# Patient Record
Sex: Male | Born: 1995 | Race: Asian | Hispanic: No | Marital: Single | Smoking: Never smoker
Health system: Southern US, Community
[De-identification: ages and names within clinical notes are randomized; demographics above are authoritative.]

## PROBLEM LIST (undated history)

## (undated) DIAGNOSIS — R112 Nausea with vomiting, unspecified: Secondary | ICD-10-CM

## (undated) DIAGNOSIS — J209 Acute bronchitis, unspecified: Secondary | ICD-10-CM

## (undated) DIAGNOSIS — I10 Essential (primary) hypertension: Secondary | ICD-10-CM

## (undated) DIAGNOSIS — N289 Disorder of kidney and ureter, unspecified: Secondary | ICD-10-CM

## (undated) HISTORY — DX: Nausea with vomiting, unspecified: R11.2

## (undated) HISTORY — PX: OTHER SURGICAL HISTORY: SHX169

## (undated) HISTORY — DX: Essential (primary) hypertension: I10

---
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
## 2015-02-25 ENCOUNTER — Emergency Department (HOSPITAL_COMMUNITY)
Admission: EM | Admit: 2015-02-25 | Discharge: 2015-02-25 | Disposition: A | Discharge status: Home / Self Care | Payer: 59 | Attending: Emergency Medicine | Admitting: Emergency Medicine

## 2015-02-25 ENCOUNTER — Encounter (HOSPITAL_COMMUNITY): Payer: Self-pay | Admitting: Emergency Medicine

## 2015-02-25 ENCOUNTER — Emergency Department (HOSPITAL_COMMUNITY): Payer: 59

## 2015-02-25 ENCOUNTER — Telehealth: Payer: Self-pay | Admitting: Family

## 2015-02-25 DIAGNOSIS — Z8709 Personal history of other diseases of the respiratory system: Secondary | ICD-10-CM | POA: Diagnosis not present

## 2015-02-25 DIAGNOSIS — R197 Diarrhea, unspecified: Secondary | ICD-10-CM | POA: Diagnosis not present

## 2015-02-25 DIAGNOSIS — R109 Unspecified abdominal pain: Secondary | ICD-10-CM | POA: Insufficient documentation

## 2015-02-25 DIAGNOSIS — I10 Essential (primary) hypertension: Secondary | ICD-10-CM | POA: Insufficient documentation

## 2015-02-25 DIAGNOSIS — Z87448 Personal history of other diseases of urinary system: Secondary | ICD-10-CM | POA: Diagnosis not present

## 2015-02-25 DIAGNOSIS — Z79899 Other long term (current) drug therapy: Secondary | ICD-10-CM | POA: Insufficient documentation

## 2015-02-25 DIAGNOSIS — R112 Nausea with vomiting, unspecified: Secondary | ICD-10-CM | POA: Diagnosis not present

## 2015-02-25 DIAGNOSIS — Z792 Long term (current) use of antibiotics: Secondary | ICD-10-CM | POA: Diagnosis not present

## 2015-02-25 HISTORY — DX: Disorder of kidney and ureter, unspecified: N28.9

## 2015-02-25 HISTORY — DX: Acute bronchitis, unspecified: J20.9

## 2015-02-25 LAB — URINALYSIS, ROUTINE W REFLEX MICROSCOPIC
Bilirubin Urine: NEGATIVE
GLUCOSE, UA: NEGATIVE mg/dL
KETONES UR: NEGATIVE mg/dL
LEUKOCYTES UA: NEGATIVE
Nitrite: NEGATIVE
PH: 5.5 (ref 5.0–8.0)
Protein, ur: >300 mg/dL — AB
Specific Gravity, Urine: 1.022 (ref 1.005–1.030)
Urobilinogen, UA: 0.2 mg/dL (ref 0.0–1.0)

## 2015-02-25 LAB — COMPREHENSIVE METABOLIC PANEL
ALBUMIN: 3.8 g/dL (ref 3.5–5.0)
ALK PHOS: 79 U/L (ref 38–126)
ALT: 15 U/L — AB (ref 17–63)
ANION GAP: 7 (ref 5–15)
AST: 22 U/L (ref 15–41)
BUN: 11 mg/dL (ref 6–20)
CO2: 28 mmol/L (ref 22–32)
Calcium: 9.3 mg/dL (ref 8.9–10.3)
Chloride: 102 mmol/L (ref 101–111)
Creatinine, Ser: 1.07 mg/dL (ref 0.61–1.24)
GFR calc Af Amer: >60 mL/min (ref >60)
GFR calc non Af Amer: >60 mL/min (ref >60)
GLUCOSE: 95 mg/dL (ref 65–99)
POTASSIUM: 3.8 mmol/L (ref 3.5–5.1)
SODIUM: 137 mmol/L (ref 135–145)
TOTAL PROTEIN: 6.9 g/dL (ref 6.5–8.1)
Total Bilirubin: 0.8 mg/dL (ref 0.3–1.2)

## 2015-02-25 LAB — URINE MICROSCOPIC-ADD ON

## 2015-02-25 LAB — LIPASE, BLOOD: LIPASE: 43 U/L (ref 22–51)

## 2015-02-25 LAB — CBC WITH DIFFERENTIAL/PLATELET
Basophils Absolute: 0 10*3/uL (ref 0.0–0.1)
Basophils Relative: 1 % (ref 0–1)
Eosinophils Absolute: 0.2 10*3/uL (ref 0.0–0.7)
Eosinophils Relative: 3 % (ref 0–5)
HCT: 45.3 % (ref 39.0–52.0)
Hemoglobin: 15.7 g/dL (ref 13.0–17.0)
Lymphocytes Relative: 30 % (ref 12–46)
Lymphs Abs: 1.9 10*3/uL (ref 0.7–4.0)
MCH: 29.5 pg (ref 26.0–34.0)
MCHC: 34.7 g/dL (ref 30.0–36.0)
MCV: 85.2 fL (ref 78.0–100.0)
Monocytes Absolute: 0.6 10*3/uL (ref 0.1–1.0)
Monocytes Relative: 9 % (ref 3–12)
Neutro Abs: 3.6 10*3/uL (ref 1.7–7.7)
Neutrophils Relative %: 57 % (ref 43–77)
Platelets: 222 10*3/uL (ref 150–400)
RBC: 5.32 MIL/uL (ref 4.22–5.81)
RDW: 12.5 % (ref 11.5–15.5)
WBC: 6.3 10*3/uL (ref 4.0–10.5)

## 2015-02-25 NOTE — Discharge Instructions (Signed)

## 2015-02-25 NOTE — Telephone Encounter (Signed)
Pt already scheduled on 03/10/15 at 10:30am.

## 2015-02-25 NOTE — ED Notes (Signed)
Pt. reports left abdominal pain with emesis , poor appetite / fatigue and diarrhea onset last week , denies fever or chills.

## 2015-02-25 NOTE — Telephone Encounter (Signed)
Please contact pt to arrange ED follow up. 

## 2015-02-25 NOTE — ED Provider Notes (Signed)
CSN: [id number]     Arrival date & time 02/25/15  0400 History   First MD Initiated Contact with Patient 02/25/15 [phone]     Chief Complaint  Patient presents with  . Abdominal Pain     (Consider location/radiation/quality/duration/timing/severity/associated sxs/prior Treatment) HPI Comments: Patient with hx of HTN and kidney disease [facility] presents to the ED with a chief complaint of left abdominal pain.  States that the pain is intermittent.  States that the pain can be sharp at times.  Reports associated nausea and vomiting last Friday and diarrhea yesterday.  Denies any fevers or chills. He has not taken anything to alleviate is symptoms. Mother states that he has not been taking his BP meds and believes to to be causing his symptoms.  There are no aggravating or alleviating factors.  Patient denies any pain in his testicles.  The pain does not radiate.  The history is provided by the patient. No language interpreter was used.    Past Medical History  Diagnosis Date  . Nausea and vomiting   . HTN (hypertension)   . Bronchitis, acute   . Kidney disease    Past Surgical History  Procedure Laterality Date  . No past suregeries    . Kidney biopsy     Family History  Problem Relation Age of Onset  . Hypertension Father   . Diabetes Maternal Grandmother   . Diabetes Paternal Grandfather    History  Substance Use Topics  . Smoking status: Never Smoker   . Smokeless tobacco: Never Used  . Alcohol Use: No    Review of Systems  Constitutional: Negative for fever and chills.  Respiratory: Negative for shortness of breath.   Cardiovascular: Negative for chest pain.  Gastrointestinal: Positive for nausea, vomiting, abdominal pain and diarrhea. Negative for constipation.  Genitourinary: Positive for flank pain. Negative for dysuria.  All other systems reviewed and are negative.     Allergies  Review of patient's allergies indicates no known  allergies.  Home Medications   Prior to Admission medications   Medication Sig Start Date End Date Taking? Authorizing Provider  albuterol (PROVENTIL HFA;VENTOLIN HFA) 108 (90 BASE) MCG/ACT inhaler Inhale 2 puffs into the lungs every 6 (six) hours as needed for wheezing or shortness of breath. 10/16/13   Morrell RiddleSarah L Weber, PA-C  azithromycin (ZITHROMAX) 250 MG tablet Take 2 tablets by mouth on day 1, followed by 1 tablet by mouth daily for 4 days. 09/11/14   Ramon DredgeEdward Saguier, PA-C  benzonatate (TESSALON) 100 MG capsule Take 1 capsule (100 mg total) by mouth 3 (three) times daily as needed for cough. 09/11/14   Ramon DredgeEdward Saguier, PA-C  lisinopril (PRINIVIL,ZESTRIL) 10 MG tablet Take 10 mg by mouth daily.    Historical Provider, MD  mycophenolate (CELLCEPT) 500 MG tablet Take 500 mg by mouth 2 (two) times daily.    Historical Provider, MD  Omega-3 Fatty Acids (FISH OIL) 1000 MG CAPS Take by mouth.    Historical Provider, MD   BP 114/62 mmHg  Pulse 59  Temp(Src) 98.1 F (36.7 C) (Oral)  Resp 16  Ht 5\' 8"  (1.727 m)  Wt 120 lb (54.432 kg)  BMI 18.25 kg/m2  SpO2 99% Physical Exam  Constitutional: He is oriented to person, place, and time. He appears well-developed and well-nourished.  HENT:  Head: Normocephalic and atraumatic.  Eyes: Conjunctivae and EOM are normal. Pupils are equal, round, and reactive to light. Right eye exhibits no discharge. Left eye exhibits no  discharge. No scleral icterus.  Neck: Normal range of motion. Neck supple. No JVD present.  Cardiovascular: Normal rate, regular rhythm and normal heart sounds.  Exam reveals no gallop and no friction rub.   No murmur heard. Pulmonary/Chest: Effort normal and breath sounds normal. No respiratory distress. He has no wheezes. He has no rales. He exhibits no tenderness.  Abdominal: Soft. He exhibits no distension and no mass. There is no tenderness. There is no rebound and no guarding.  No focal abdominal tenderness, no RLQ tenderness or pain  at McBurney's point, no RUQ tenderness or Murphy's sign, no left-sided abdominal tenderness, no fluid wave, or signs of peritonitis  No CVA tenderness  Musculoskeletal: Normal range of motion. He exhibits no edema or tenderness.  Neurological: He is alert and oriented to person, place, and time.  Skin: Skin is warm and dry.  Psychiatric: He has a normal mood and affect. His behavior is normal. Judgment and thought content normal.  Nursing note and vitals reviewed.   ED Course  Procedures (including critical care time) Results for orders placed or performed during the hospital encounter of 02/25/15  CBC with Differential  Result Value Ref Range   WBC 6.3 4.0 - 10.5 K/uL   RBC 5.32 4.22 - 5.81 MIL/uL   Hemoglobin 15.7 13.0 - 17.0 g/dL   HCT 21.345.3 08.639.0 - 57.852.0 %   MCV 85.2 78.0 - 100.0 fL   MCH 29.5 26.0 - 34.0 pg   MCHC 34.7 30.0 - 36.0 g/dL   RDW 46.912.5 62.911.5 - 52.815.5 %   Platelets 222 150 - 400 K/uL   Neutrophils Relative % 57 43 - 77 %   Neutro Abs 3.6 1.7 - 7.7 K/uL   Lymphocytes Relative 30 12 - 46 %   Lymphs Abs 1.9 0.7 - 4.0 K/uL   Monocytes Relative 9 3 - 12 %   Monocytes Absolute 0.6 0.1 - 1.0 K/uL   Eosinophils Relative 3 0 - 5 %   Eosinophils Absolute 0.2 0.0 - 0.7 K/uL   Basophils Relative 1 0 - 1 %   Basophils Absolute 0.0 0.0 - 0.1 K/uL  Comprehensive metabolic panel  Result Value Ref Range   Sodium 137 135 - 145 mmol/L   Potassium 3.8 3.5 - 5.1 mmol/L   Chloride 102 101 - 111 mmol/L   CO2 28 22 - 32 mmol/L   Glucose, Bld 95 65 - 99 mg/dL   BUN 11 6 - 20 mg/dL   Creatinine, Ser 4.131.07 0.61 - 1.24 mg/dL   Calcium 9.3 8.9 - 24.410.3 mg/dL   Total Protein 6.9 6.5 - 8.1 g/dL   Albumin 3.8 3.5 - 5.0 g/dL   AST 22 15 - 41 U/L   ALT 15 (L) 17 - 63 U/L   Alkaline Phosphatase 79 38 - 126 U/L   Total Bilirubin 0.8 0.3 - 1.2 mg/dL   GFR calc non Af Amer >60 >60 mL/min   GFR calc Af Amer >60 >60 mL/min   Anion gap 7 5 - 15  Lipase, blood  Result Value Ref Range   Lipase 43  22 - 51 U/L  Urinalysis, Routine w reflex microscopic  Result Value Ref Range   Color, Urine YELLOW YELLOW   APPearance CLEAR CLEAR   Specific Gravity, Urine 1.022 1.005 - 1.030   pH 5.5 5.0 - 8.0   Glucose, UA NEGATIVE NEGATIVE mg/dL   Hgb urine dipstick LARGE (A) NEGATIVE   Bilirubin Urine NEGATIVE NEGATIVE   Ketones, ur  NEGATIVE NEGATIVE mg/dL   Protein, ur >045 (A) NEGATIVE mg/dL   Urobilinogen, UA 0.2 0.0 - 1.0 mg/dL   Nitrite NEGATIVE NEGATIVE   Leukocytes, UA NEGATIVE NEGATIVE  Urine microscopic-add on  Result Value Ref Range   Squamous Epithelial / LPF FEW (A) RARE   WBC, UA 0-2 <3 WBC/hpf   RBC / HPF 21-50 <3 RBC/hpf   Bacteria, UA FEW (A) RARE   Urine-Other MUCOUS PRESENT    Ct Abdomen Pelvis Wo Contrast  02/25/2015   CLINICAL DATA:  Left abdominal pain with vomiting, diarrhea starting last week, decreased appetite blood in urine  EXAM: CT ABDOMEN AND PELVIS WITHOUT CONTRAST  TECHNIQUE: Multidetector CT imaging of the abdomen and pelvis was performed following the standard protocol without IV contrast.  COMPARISON:  CT scan 02/19/2013  FINDINGS: Sagittal images of the spine are unremarkable. The study is suboptimal without IV and oral contrast. Lung bases are unremarkable. Unenhanced liver shows no biliary ductal dilatation.  No calcified gallstones are noted within gallbladder. No aortic aneurysm. Unenhanced pancreas, spleen and adrenal glands are unremarkable. Unenhanced kidneys are symmetrical in size. No nephrolithiasis. No hydronephrosis or hydroureter. No calcified ureteral calculi.  There is no pericecal inflammation. Normal appendix noted in axial image 60.  No small bowel obstruction. No ascites or free air. No adenopathy. Bilateral distal ureter is unremarkable. No calcified calculi are noted within urinary bladder. Prostate gland and seminal vesicles are thick bar global. Moderate colonic gas noted within distended rectum. There is no inguinal adenopathy. No destructive  bony lesions are noted within pelvis.  IMPRESSION: 1. No nephrolithiasis.  No hydronephrosis or hydroureter. 2. Normal appendix.  No pericecal inflammation. 3. No calcified ureteral calculi. 4. No small bowel obstruction. 5. Moderate gas noted within distended rectum.   Electronically Signed   By: Natasha Mead M.D.   On: 02/25/2015 07:59      EKG Interpretation None      MDM   Final diagnoses:  Flank pain    Patient with left flank/abdominal pain.  Hx of kidney disease and HTN.  Not complaint with BP meds.  UA is remarkable for RBCs and hemoglobin.  No other lab abnormalities.  Normal Cr.  Will check CT to rule out stone or other emergent process.  CT is negative for acute process.  Will recommend follow-up with his specialist.  VSS.  Labs are reassuring here.   Roxy Horseman, PA-C 02/25/15 [id number]  Blake Divine, MD 02/25/15 [phone]

## 2015-03-10 ENCOUNTER — Ambulatory Visit (INDEPENDENT_AMBULATORY_CARE_PROVIDER_SITE_OTHER): Payer: 59 | Admitting: Family

## 2015-03-10 ENCOUNTER — Encounter: Payer: Self-pay | Admitting: Family

## 2015-03-10 VITALS — BP 110/52 | HR 56 | Temp 97.6°F | Resp 16 | Ht 69.0 in | Wt 130.8 lb

## 2015-03-10 DIAGNOSIS — B351 Tinea unguium: Secondary | ICD-10-CM

## 2015-03-10 DIAGNOSIS — R112 Nausea with vomiting, unspecified: Secondary | ICD-10-CM | POA: Diagnosis not present

## 2015-03-10 DIAGNOSIS — L609 Nail disorder, unspecified: Secondary | ICD-10-CM | POA: Diagnosis not present

## 2015-03-10 DIAGNOSIS — I10 Essential (primary) hypertension: Secondary | ICD-10-CM | POA: Insufficient documentation

## 2015-03-10 DIAGNOSIS — N289 Disorder of kidney and ureter, unspecified: Secondary | ICD-10-CM | POA: Diagnosis not present

## 2015-03-10 DIAGNOSIS — N028 Recurrent and persistent hematuria with other morphologic changes: Secondary | ICD-10-CM | POA: Insufficient documentation

## 2015-03-10 NOTE — Progress Notes (Signed)
Subjective:    Patient ID: Seth Jimenez, male    DOB: 12/12/1995, 19 y.o.   MRN: [id number]  HPI   Seth Jimenez is an 19 yr old male who presents today for ED follow up. He was seen in the ED on 5/18 with chief complaint of left sided abdominal pain. Records are reviewed.  He had associated nausea, vomiting and diarrhea prior to his ED visit.  ED records are reviewed. He was noted to have blood in his urine (he is followed by Nephrology at [facility] for history of kidney disease).  CT abd/pelvis was performed which was unremarkable.  Reports that symptoms are resolved.    Kidney disease- he is maintained on cellcept.  He see Dr. Dionicio Stall- pediatric nephrologist at [facility]. He does not know anything more about his kidney disease- reports that it is "mild."   He is not currently taking lisinopril.  States that his BP has been ok.   Reports some discoloration beneath the nail beds on both great toes.    Review of Systems See HPI  Past Medical History  Diagnosis Date  . Nausea and vomiting   . HTN (hypertension)   . Bronchitis, acute   . Kidney disease     History   Social History  . Marital Status: Single    Spouse Name: N/A  . Number of Children: N/A  . Years of Education: N/A   Occupational History  . Not on file.   Social History Main Topics  . Smoking status: Never Smoker   . Smokeless tobacco: Never Used  . Alcohol Use: No  . Drug Use: No  . Sexual Activity: Not on file   Other Topics Concern  . Not on file   Social History Narrative   Patient is a Consulting civil engineer at [employer]    Past Surgical History  Procedure Laterality Date  . No past suregeries    . Kidney biopsy      Family History  Problem Relation Age of Onset  . Hypertension Father   . Diabetes Maternal Grandmother   . Diabetes Paternal Grandfather     No Known Allergies  Current Outpatient Prescriptions on File Prior to Visit  Medication Sig Dispense Refill  . mycophenolate  (CELLCEPT) 500 MG tablet Take 500 mg by mouth 2 (two) times daily.    Marland Kitchen albuterol (PROVENTIL HFA;VENTOLIN HFA) 108 (90 BASE) MCG/ACT inhaler Inhale 2 puffs into the lungs every 6 (six) hours as needed for wheezing or shortness of breath. (Patient not taking: Reported on 02/25/2015) 1 Inhaler 0  . lisinopril (PRINIVIL,ZESTRIL) 10 MG tablet Take 10 mg by mouth daily.    Marland Kitchen lisinopril (PRINIVIL,ZESTRIL) 30 MG tablet Take 30 mg by mouth daily.    . Omega-3 Fatty Acids (FISH OIL) 1000 MG CAPS Take by mouth.     Current Facility-Administered Medications on File Prior to Visit  Medication Dose Route Frequency Provider Last Rate Last Dose  . albuterol (PROVENTIL) (2.5 MG/3ML) 0.083% nebulizer solution 2.5 mg  2.5 mg Nebulization Once Morrell Riddle, PA-C        BP 110/52 mmHg  Pulse 56  Temp(Src) 97.6 F (36.4 C) (Oral)  Resp 16  Ht  (1.753 m)  Wt 130 lb 12.8 oz (59.33 kg)  BMI 19.31 kg/m2  SpO2 99%       Objective:   Physical Exam  Constitutional: He is oriented to person, place, and time. He appears well-developed and well-nourished. No distress.  HENT:  Head: Normocephalic and atraumatic.  Cardiovascular: Normal rate and regular rhythm.   No murmur heard. Pulmonary/Chest: Effort normal and breath sounds normal. No respiratory distress. He has no wheezes. He has no rales.  Musculoskeletal: He exhibits no edema.  Neurological: He is alert and oriented to person, place, and time.  Skin: Skin is warm and dry.  Bilateral great toenails have some dark discoloration beneath distal nail tip   Psychiatric: He has a normal mood and affect. His behavior is normal. Thought content normal.          Assessment & Plan:

## 2015-03-10 NOTE — Assessment & Plan Note (Signed)
Will request records from his nephrologist.  Details unclear.

## 2015-03-10 NOTE — Assessment & Plan Note (Signed)
BP stable off of lisinopril.  Not clear if nephrology wishes for him to be on ACE for renal reasons. Will request records.

## 2015-03-10 NOTE — Assessment & Plan Note (Signed)
?   Fungal versus trauma. Will refer to dermatology for further evaluation.

## 2015-03-10 NOTE — Patient Instructions (Signed)
Please schedule a complete physical at the front desk.  You will be contacted about your referral to dermatology.

## 2015-03-10 NOTE — Progress Notes (Signed)
Pre visit review using our clinic review tool, if applicable. No additional management support is needed unless otherwise documented below in the visit note. 

## 2015-03-10 NOTE — Assessment & Plan Note (Signed)
Resolved. Sounded like a viral gastroenteritis.

## 2015-03-25 ENCOUNTER — Telehealth: Payer: Self-pay | Admitting: Family

## 2015-03-25 NOTE — Telephone Encounter (Signed)
Pre Visit letter sent  °

## 2015-03-26 ENCOUNTER — Telehealth: Payer: Self-pay | Admitting: *Deleted

## 2015-03-26 NOTE — Telephone Encounter (Signed)
Received records from Dionicio Stall at [facility] and forwarded to Provider for review.

## 2015-04-15 ENCOUNTER — Telehealth: Payer: Self-pay | Admitting: *Deleted

## 2015-04-15 NOTE — Telephone Encounter (Signed)
Patient answered phone for PVC and confirmed name and DOB, but when asked about appointment he was not aware of it and hung up phone.

## 2015-04-16 ENCOUNTER — Encounter: Payer: Self-pay | Admitting: Family

## 2015-04-16 ENCOUNTER — Ambulatory Visit (INDEPENDENT_AMBULATORY_CARE_PROVIDER_SITE_OTHER): Payer: Commercial Managed Care - HMO | Admitting: Family

## 2015-04-16 ENCOUNTER — Telehealth: Payer: Self-pay | Admitting: Family

## 2015-04-16 VITALS — BP 106/70 | HR 53 | Temp 97.6°F | Resp 16 | Ht 69.0 in | Wt 132.0 lb

## 2015-04-16 DIAGNOSIS — Z23 Encounter for immunization: Secondary | ICD-10-CM | POA: Diagnosis not present

## 2015-04-16 DIAGNOSIS — Z0000 Encounter for general adult medical examination without abnormal findings: Secondary | ICD-10-CM | POA: Diagnosis not present

## 2015-04-16 DIAGNOSIS — N028 Recurrent and persistent hematuria with other morphologic changes: Secondary | ICD-10-CM

## 2015-04-16 LAB — LIPID PANEL
CHOL/HDL RATIO: 3
CHOLESTEROL: 188 mg/dL (ref 0–200)
HDL: 62.3 mg/dL (ref >39.00)
LDL CALC: 109 mg/dL — AB (ref 0–99)
NONHDL: 125.7
TRIGLYCERIDES: 84 mg/dL (ref 0.0–149.0)
VLDL: 16.8 mg/dL (ref 0.0–40.0)

## 2015-04-16 LAB — HEPATITIS B SURFACE ANTIBODY, QUANTITATIVE: Hepatitis B-Post: 0.6 m[IU]/mL

## 2015-04-16 NOTE — Assessment & Plan Note (Signed)
Immunizations reviewed- due for meningoccocal booster, varicella #2 and Hep A #2.  Obtain MMR and Hep B titers (needs evaluated for school).    Continue healthy diet, exercise.  Obtain routine labs.  Obtain lipid panel.  Had recent cmet, cbc.

## 2015-04-16 NOTE — Patient Instructions (Signed)
Please complete lab work prior to leaving.  We will contact you with your results and further recommendations.  

## 2015-04-16 NOTE — Telephone Encounter (Addendum)
Opened in error

## 2015-04-16 NOTE — Progress Notes (Signed)
Subjective:    Patient ID: Seth Jimenez, male    DOB: 1996/02/26, 19 y.o.   MRN: [id number]  HPI  Patient presents today for complete physical.  Immunizations: Diet: healthy Exercise: badminton, and gym Eye:  Exam up to date Dental: has apt today Was evaluated in ED in May for flank pain which has resolved.   Kidney disease- [facility] with [facility].     Review of Systems  Constitutional: Negative for unexpected weight change.  HENT: Negative for rhinorrhea.   Respiratory: Negative for cough and shortness of breath.   Cardiovascular: Negative for chest pain.  Gastrointestinal: Negative for diarrhea and constipation.  Genitourinary: Negative for dysuria and frequency.  Musculoskeletal: Negative for myalgias and arthralgias.  Skin: Negative for rash.  Neurological: Negative for headaches.  Hematological: Negative for adenopathy.  Psychiatric/Behavioral:       Denies depression/anxiety   Past Medical History  Diagnosis Date  . Nausea and vomiting   . HTN (hypertension)   . Bronchitis, acute   . Kidney disease     History   Social History  . Marital Status: Single    Spouse Name: N/A  . Number of Children: N/A  . Years of Education: N/A   Occupational History  . Not on file.   Social History Main Topics  . Smoking status: Never Smoker   . Smokeless tobacco: Never Used  . Alcohol Use: No  . Drug Use: No  . Sexual Activity: Not on file   Other Topics Concern  . Not on file   Social History Narrative   Patient is a Consulting civil engineerstudent at [employer]    Past Surgical History  Procedure Laterality Date  . No past suregeries    . Kidney biopsy      Family History  Problem Relation Age of Onset  . Hypertension Father   . Diabetes Maternal Grandmother   . Diabetes Paternal Grandfather     No Known Allergies  Current Outpatient Prescriptions on File Prior to Visit  Medication Sig  Dispense Refill  . mycophenolate (CELLCEPT) 500 MG tablet Take 500 mg by mouth 2 (two) times daily.    . Omega-3 Fatty Acids (FISH OIL) 1000 MG CAPS Take by mouth.     Current Facility-Administered Medications on File Prior to Visit  Medication Dose Route Frequency Provider Last Rate Last Dose  . albuterol (PROVENTIL) (2.5 MG/3ML) 0.083% nebulizer solution 2.5 mg  2.5 mg Nebulization Once Morrell RiddleSarah L Weber, PA-C        BP 106/70 mmHg  Pulse 53  Temp(Src) 97.6 F (36.4 C) (Oral)  Resp 16  Ht 5\' 9"  (1.753 m)  Wt 132 lb (59.875 kg)  BMI 19.48 kg/m2  SpO2 98%       Objective:   Physical Exam  Physical Exam  Constitutional: He is oriented to person, place, and time. He appears well-developed and well-nourished. No distress.  HENT:  Head: Normocephalic and atraumatic.  Right Ear: Tympanic membrane and ear canal normal.  Left Ear: Tympanic membrane and ear canal normal.  Mouth/Throat: Oropharynx is clear and moist.  Eyes: Pupils are equal, round, and reactive to light. No scleral icterus.  Neck: Normal range of motion. No thyromegaly present.  Cardiovascular: Normal rate and regular rhythm.   No murmur heard. Pulmonary/Chest: Effort normal and breath sounds normal. No respiratory distress. He has no wheezes. He has no rales. He exhibits no tenderness.  Abdominal:  Soft. Bowel sounds are normal. He exhibits no distension and no mass. There is no tenderness. There is no rebound and no guarding.  Musculoskeletal: He exhibits no edema.  Lymphadenopathy:    He has no cervical adenopathy.  Neurological: He is alert and oriented to person, place, and time. He has normal patellar reflexes. He exhibits normal muscle tone. Coordination normal.  Skin: Skin is warm and dry.  Psychiatric: He has a normal mood and affect. His behavior is normal. Judgment and thought content normal.          Assessment & Plan:         Assessment & Plan:

## 2015-04-16 NOTE — Progress Notes (Signed)
Pre visit review using our clinic review tool, if applicable. No additional management support is needed unless otherwise documented below in the visit note. 

## 2015-04-16 NOTE — Assessment & Plan Note (Addendum)
Maintained on cellcept per Dr.Chen. Last Cr WNL. Reviewed notes from Dr. Nicky Pughhen's office. He has also placed the pt on vit D supplement and continued his lisinopril due to hx of elevated BP.  Lab Results  Component Value Date   CREATININE 1.07 02/25/2015

## 2015-04-16 NOTE — Addendum Note (Signed)
Addended by: Sandford Craze'SULLIVAN, Avonlea Sima on: 04/16/2015 01:19 PM   Modules accepted: Medications, SmartSet

## 2015-04-17 LAB — MEASLES/MUMPS/RUBELLA IMMUNITY
MUMPS IGG: 123 [AU]/ml — AB (ref <9.00)
Rubella: 0.79 Index (ref <0.90)
Rubeola IgG: 50.6 AU/mL — ABNORMAL HIGH (ref <25.00)

## 2015-04-20 ENCOUNTER — Telehealth: Payer: Self-pay | Admitting: Family

## 2015-04-20 NOTE — Telephone Encounter (Signed)
Reviewed titers.  Pt needs the following vaccines please:  MMR booster Hep B series Dtap series- we have no record of this,  Usually given to infants.  Can he request records from his high school?  If they do not have, then we will need to administer series.

## 2015-04-21 NOTE — Telephone Encounter (Signed)
Left detailed message on pt's cell# re: below recommendations and to return my call.

## 2015-05-01 NOTE — Telephone Encounter (Signed)
Patient mom states that patient will be picking up records from pediatric office and will callback as to what immunizations he has had. [phone]

## 2015-05-12 ENCOUNTER — Telehealth: Payer: Self-pay | Admitting: Family

## 2015-05-12 ENCOUNTER — Other Ambulatory Visit: Payer: Self-pay | Admitting: Family

## 2015-05-12 ENCOUNTER — Other Ambulatory Visit (INDEPENDENT_AMBULATORY_CARE_PROVIDER_SITE_OTHER): Payer: Commercial Managed Care - HMO

## 2015-05-12 ENCOUNTER — Ambulatory Visit: Payer: Commercial Managed Care - HMO

## 2015-05-12 DIAGNOSIS — Z0000 Encounter for general adult medical examination without abnormal findings: Secondary | ICD-10-CM

## 2015-05-12 LAB — HEPATITIS B SURFACE ANTIBODY,QUALITATIVE: Hep B S Ab: NEGATIVE

## 2015-05-12 NOTE — Telephone Encounter (Signed)
Reviewed pt records.  Missing  Dtap, polio, MMR, hep B series. Will obtain titers and make further recs.

## 2015-05-13 LAB — MEASLES/MUMPS/RUBELLA IMMUNITY
MUMPS IGG: 123 [AU]/ml — AB (ref <9.00)
Rubella: 0.68 Index (ref <0.90)
Rubeola IgG: 42 AU/mL — ABNORMAL HIGH (ref <25.00)

## 2015-05-18 ENCOUNTER — Telehealth: Payer: Self-pay | Admitting: *Deleted

## 2015-05-18 LAB — POLIOVIRUS ANTIBODIES, TYPES 1, 2, AND 3

## 2015-05-18 NOTE — Telephone Encounter (Signed)
Per verbal from PCP, after further review of immunization requirements, Polio is not required. Cancelled poliovirus panel and confirmed that echovirus has already been cancelled per [place] at [place].

## 2015-05-18 NOTE — Telephone Encounter (Signed)
Reviewed titers and immunization requirements:  Advise:  That he start Hep B series.  Also needs MMR now and again in 4 weeks. Meningococcal booster

## 2015-05-18 NOTE — Telephone Encounter (Signed)
Spoke with Lilia at [facility]. She states polio panel was not ordered and she will see if they can add it on now. If so, turnaround time will be 7 days. Awaiting status from [place].

## 2015-05-18 NOTE — Telephone Encounter (Signed)
-----   Message from Sandford Craze, NP sent at 05/18/2015 12:53 PM EDT ----- Could you please check status of the poli panel?

## 2015-05-19 NOTE — Telephone Encounter (Signed)
Left message for pt to return my call.

## 2015-05-19 NOTE — Telephone Encounter (Signed)
Notified pt's mom and she voices understanding. Nurse visit scheduled for 05/21/15 at 2pm.  Pt will get 1st Hep B, 1st MMR and meningococcal booster.  Pt will need to return in 1 month for 2nd Hept B and MMR. Vaccine order set initiated and forwarded to PCP for completion.

## 2015-05-21 ENCOUNTER — Ambulatory Visit: Payer: Commercial Managed Care - HMO

## 2015-05-21 DIAGNOSIS — Z23 Encounter for immunization: Secondary | ICD-10-CM

## 2015-05-21 LAB — OTHER SOLSTAS TEST: Miscellaneous Test: 35139

## 2015-05-21 MED ORDER — HEPATITIS B VAC RECOMBINANT 5 MCG/0.5ML IJ SUSP
0.5000 mL | Freq: Once | INTRAMUSCULAR | Status: AC
  Administered 2015-05-21: 5 ug via INTRAMUSCULAR

## 2015-05-22 ENCOUNTER — Telehealth: Payer: Self-pay | Admitting: Family

## 2015-05-22 NOTE — Telephone Encounter (Signed)
Pt did not receive the MMR vaccine at the nurse visit.  Could you please arrange nurse visit for when I am here and I can complete his form at time of his visit. Meningococcal booster is complete. He does not need this.

## 2015-05-25 ENCOUNTER — Telehealth: Payer: Self-pay | Admitting: Family

## 2015-05-25 ENCOUNTER — Telehealth: Payer: Self-pay | Admitting: *Deleted

## 2015-05-25 NOTE — Telephone Encounter (Signed)
See 05/25/15 phone note 

## 2015-05-25 NOTE — Telephone Encounter (Signed)
Scheduled for 05/26/15 10:30am for MMR. I notified mom that you will call her back once you speak to Melissa about the titer. Ph# [phone]. °

## 2015-05-25 NOTE — Telephone Encounter (Signed)
Left detailed message on mother's cell # that immunization record has been placed at front desk but pt still needs to start MMR series and to call back to schedule appt. Also placed note on immunization record to schedule nurse visit.

## 2015-05-25 NOTE — Telephone Encounter (Signed)
Yes, rubella titer was negative. Thanks.

## 2015-05-25 NOTE — Telephone Encounter (Signed)
Left detailed message on cell# for pt to call me back and start MMR.  Will need 2nd MMR in 4 weeks (when returns for 2nd hep B).

## 2015-05-25 NOTE — Telephone Encounter (Signed)
Melissa--did you see Mumps titer that I printed on this pt?  Will pt still need MMR series?

## 2015-05-25 NOTE — Telephone Encounter (Signed)
Spoke with Alcario Drought at [place]. She is not sure how test was resulted when it was cancelled in the system. She originally thought test was cancelled after the specimen was received in the lab and sent to [facility] for testing. Advised her that per EMR tracking, test was ordered at 14:56 on 05/12/15 and cancelled at 15:03 on 05/12/15 in the EMR and I received confirmation on 05/18/15 that test was cancelled. She will remove charge for echovirus panel.  Notes Recorded by Sandford Craze, NP on 05/21/2015 at 3:11 PM Could you ask lab to remove charges for this test, I dont' think pt should be charged since we asked for test to be cancelled.

## 2015-05-25 NOTE — Telephone Encounter (Signed)
Notified pt's mom that pt should keep nurse visit for tomorrow and pt will need to schedule nurse visit in 1 month for 2nd Hep B and MMR.

## 2015-05-25 NOTE — Telephone Encounter (Signed)
Caller name: Markeis Allman Relationship to patient: mother Can be reached: [phone]  Reason for call: Pt needs copy of immunization record printed. He will come in to pick up. Please call mom when it is printed.

## 2015-05-25 NOTE — Telephone Encounter (Signed)
-----   Message from Sandford Craze, NP sent at 05/21/2015  3:11 PM EDT ----- Could you ask lab to remove charges for this test, I dont' think pt should be charged since we asked for test to be cancelled.

## 2015-05-25 NOTE — Telephone Encounter (Signed)
Form copied and will be forwarded to scanning once pt completes MMR tomorrow.  Form placed in triage room folder.

## 2015-05-26 ENCOUNTER — Ambulatory Visit (INDEPENDENT_AMBULATORY_CARE_PROVIDER_SITE_OTHER): Payer: Commercial Managed Care - HMO | Admitting: *Deleted

## 2015-05-26 DIAGNOSIS — Z23 Encounter for immunization: Secondary | ICD-10-CM

## 2015-05-26 DIAGNOSIS — IMO0001 Reserved for inherently not codable concepts without codable children: Secondary | ICD-10-CM

## 2015-05-26 NOTE — Progress Notes (Signed)
Pre visit review using our clinic review tool, if applicable. No additional management support is needed unless otherwise documented below in the visit note.  Patient presented for MMR (per phone note 05/25/15) for school.   Patient tolerated injection well.

## 2015-06-26 ENCOUNTER — Ambulatory Visit (INDEPENDENT_AMBULATORY_CARE_PROVIDER_SITE_OTHER): Payer: Commercial Managed Care - HMO | Admitting: *Deleted

## 2015-06-26 DIAGNOSIS — Z23 Encounter for immunization: Secondary | ICD-10-CM | POA: Diagnosis not present

## 2015-06-26 NOTE — Progress Notes (Signed)
Pre visit review using our clinic review tool, if applicable. No additional management support is needed unless otherwise documented below in the visit note.  Per phone note:   Ronny Flurry, CMA at 05/25/2015 3:06 PM     Status: Signed       Expand All Collapse All   Notified pt's mom that pt should keep nurse visit for tomorrow and pt will need to schedule nurse visit in 1 month for 2nd Hep B and MMR.       Patient tolerated injection well.  Last Hep B injection scheduled 12/24/14.

## 2015-07-08 ENCOUNTER — Telehealth: Payer: Self-pay | Admitting: *Deleted

## 2015-07-08 NOTE — Telephone Encounter (Signed)
Called patient and left voicemail to notify that he has [employer] forms here (immunization records, etc) and they are available at front desk for pick-up.

## 2015-11-06 ENCOUNTER — Telehealth: Payer: Self-pay | Admitting: Family

## 2015-11-06 NOTE — Telephone Encounter (Signed)
Left message for patient to call with Flu Shot information °

## 2015-12-14 ENCOUNTER — Telehealth: Payer: Self-pay | Admitting: Family

## 2015-12-14 NOTE — Telephone Encounter (Signed)
LM with mother for pt to call and schedule flu shot or update records.

## 2015-12-24 ENCOUNTER — Ambulatory Visit (INDEPENDENT_AMBULATORY_CARE_PROVIDER_SITE_OTHER): Payer: Commercial Managed Care - HMO | Admitting: Behavioral Health

## 2015-12-24 DIAGNOSIS — Z23 Encounter for immunization: Secondary | ICD-10-CM

## 2015-12-24 NOTE — Progress Notes (Signed)
Pre visit review using our clinic review tool, if applicable. No additional management support is needed unless otherwise documented below in the visit note.  Patient in office today for Hepatitis B vaccine. IM given in Left Deltoid. Patient tolerated injection well.

## 2017-10-25 ENCOUNTER — Encounter: Payer: Self-pay | Admitting: Physician Assistant

## 2017-10-25 ENCOUNTER — Ambulatory Visit: Payer: 59 | Admitting: Physician Assistant

## 2017-10-25 VITALS — BP 122/72 | HR 78 | Temp 98.1°F | Resp 17 | Ht 70.0 in | Wt 139.0 lb

## 2017-10-25 DIAGNOSIS — J069 Acute upper respiratory infection, unspecified: Secondary | ICD-10-CM | POA: Diagnosis not present

## 2017-10-25 MED ORDER — GUAIFENESIN ER 1200 MG PO TB12: 1.0000 | ORAL_TABLET | Freq: Two times a day (BID) | ORAL | 1 refills | Status: DC | PRN

## 2017-10-25 MED ORDER — AMOXICILLIN 500 MG PO CAPS: 500.0000 mg | ORAL_CAPSULE | Freq: Two times a day (BID) | ORAL | 0 refills | Status: AC

## 2017-10-25 MED ORDER — IPRATROPIUM BROMIDE 0.03 % NA SOLN: 2.0000 | Freq: Two times a day (BID) | NASAL | 0 refills | Status: DC

## 2017-10-25 MED ORDER — BENZONATATE 100 MG PO CAPS: 100.0000 mg | ORAL_CAPSULE | Freq: Three times a day (TID) | ORAL | 0 refills | Status: DC | PRN

## 2017-10-25 NOTE — Progress Notes (Deleted)
[facility] [address], [place] [id number] [phone]  Date:  10/25/2017   Name:  Seth Jimenez   DOB:  June 02, 1996   MRN:  [id number]  PCP:  Sandford Craze'Sullivan, Melissa, NP    History of Present Illness:  Seth Jimenez is a 22 y.o. male patient who presents to PCP with  Chief Complaint  Patient presents with  . Cough       Patient Active Problem List   Diagnosis Date Noted  . Preventative health care 04/16/2015  . Nail problem 03/10/2015  . 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 HTN (hypertension) 03/10/2015  . Acne 09/05/2012    Past Medical History:  Diagnosis Date  . Bronchitis, acute   . HTN (hypertension)   . Kidney disease    IgA nephropathy  . Nausea and vomiting     Past Surgical History:  Procedure Laterality Date  . kidney biopsy  03/2013   IgA nephropathy with mesangial hyperscellularity, mild acute tubulo-interstital nephritis   . no past suregeries      Social History   Tobacco Use  . Smoking status: Never Smoker  . Smokeless tobacco: Never Used  Substance Use Topics  . Alcohol use: No  . Drug use: No    Family History  Problem Relation Age of Onset  . Hypertension Father   . Diabetes Maternal Grandmother   . Diabetes Paternal Grandfather     No Known Allergies  Medication list has been reviewed and updated.  Current Outpatient Medications on File Prior to Visit  Medication Sig Dispense Refill  . lisinopril (PRINIVIL,ZESTRIL) 30 MG tablet Take 30 mg by mouth daily.    . mycophenolate (CELLCEPT) 500 MG tablet Take 500 mg by mouth 2 (two) times daily.    . Omega-3 Fatty Acids (FISH OIL) 1000 MG CAPS Take by mouth.    . Vitamin D, Ergocalciferol, (DRISDOL) 50000 UNITS CAPS capsule Take 50,000 Units by mouth every 7 (seven) days.     Current Facility-Administered Medications on File Prior to Visit  Medication Dose Route Frequency Provider Last Rate Last Dose  . albuterol (PROVENTIL) (2.5 MG/3ML) 0.083% nebulizer solution 2.5 mg   2.5 mg Nebulization Once Weber, Sarah L, PA-C        ROS ROS otherwise unremarkable unless listed above.  Physical Examination: BP 122/72   Pulse 78   Temp 98.1 F (36.7 C) (Oral)   Resp 17   Ht 5\' 10"  (1.778 m)   Wt 139 lb (63 kg)   SpO2 98%   BMI 19.94 kg/m  Ideal Body Weight: Weight in (lb) to have BMI = 25: 173.9  Physical Exam   Assessment and Plan: Seth Jimenez is a 22 y.o. male who is here today  There are no diagnoses linked to this encounter.  Trena PlattStephanie English, PA-C [facility] 10/25/2017 4:51 PM

## 2017-10-25 NOTE — Patient Instructions (Addendum)
Please push hydration such as 64 oz of water per day.   You can also try a humidifier in your room as well as a nasal saline spray.  Please try it.  This can be obtained at any pharmacy or [employer], or grocery store.   Tylenol for pain and fever.  Upper Respiratory Infection, Adult Most upper respiratory infections (URIs) are caused by a virus. A URI affects the nose, throat, and upper air passages. The most common type of URI is often called "the common cold." Follow these instructions at home:  Take medicines only as told by your doctor.  Gargle warm saltwater or take cough drops to comfort your throat as told by your doctor.  Use a warm mist humidifier or inhale steam from a shower to increase air moisture. This may make it easier to breathe.  Drink enough fluid to keep your pee (urine) clear or pale yellow.  Eat soups and other clear broths.  Have a healthy diet.  Rest as needed.  Go back to work when your fever is gone or your doctor says it is okay. ? You may need to stay home longer to avoid giving your URI to others. ? You can also wear a face mask and wash your hands often to prevent spread of the virus.  Use your inhaler more if you have asthma.  Do not use any tobacco products, including cigarettes, chewing tobacco, or electronic cigarettes. If you need help quitting, ask your doctor. Contact a doctor if:  You are getting worse, not better.  Your symptoms are not helped by medicine.  You have chills.  You are getting more short of breath.  You have brown or red mucus.  You have yellow or brown discharge from your nose.  You have pain in your face, especially when you bend forward.  You have a fever.  You have puffy (swollen) neck glands.  You have pain while swallowing.  You have white areas in the back of your throat. Get help right away if:  You have very bad or constant: ? Headache. ? Ear pain. ? Pain in your forehead, behind your eyes, and over  your cheekbones (sinus pain). ? Chest pain.  You have long-lasting (chronic) lung disease and any of the following: ? Wheezing. ? Long-lasting cough. ? Coughing up blood. ? A change in your usual mucus.  You have a stiff neck.  You have changes in your: ? Vision. ? Hearing. ? Thinking. ? Mood. This information is not intended to replace advice given to you by your health care provider. Make sure you discuss any questions you have with your health care provider. Document Released: 03/14/2008 Document Revised: 05/29/2016 Document Reviewed: 01/01/2014 Elsevier Interactive Patient Education  2018 [employer]      IF you received an x-ray today, you will receive an invoice from [facility]. Please contact [facility] at [phone] with questions or concerns regarding your invoice.   IF you received labwork today, you will receive an invoice from [facility]. Please contact [facility] at [phone] with questions or concerns regarding your invoice.   Our billing staff will not be able to assist you with questions regarding bills from these companies.  You will be contacted with the lab results as soon as they are available. The fastest way to get your results is to activate your My Chart account. Instructions are located on the last page of this paperwork. If you have not heard from us regarding the results in  2 weeks, please contact this office.

## 2017-10-25 NOTE — Progress Notes (Signed)
[facility] [address], [place] [id number] [phone]  Date:  10/25/2017   Name:  Seth Jimenez   DOB:  [birth year]   MRN:  [id number]  PCP:  Sandford Craze'Sullivan, Melissa, NP    History of Present Illness:  Seth CommentBrandon Hao Jimenez is a 22 y.o. male patient who presents to PCP with  Chief Complaint  Patient presents with  . Cough     --1 week ago started with a cough that had progressively worsened in the next 2 days.  He has productive cough.  Nasal congestion, no runny nose.  No ear discomfort.  Mild sore throat and feels dry.  No sob or dyspnea.  No fever.  He feels lethargic.   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   --he has taken Nyquil and dayquil last night and this morning, which has not helped much.   --mother had strep throat last week.    Patient Active Problem List   Diagnosis Date Noted  . Preventative health care 04/16/2015  . Nail problem 03/10/2015  . 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 HTN (hypertension) 03/10/2015  . Acne 09/05/2012    Past Medical History:  Diagnosis Date  . Bronchitis, acute   . HTN (hypertension)   . Kidney disease    IgA nephropathy  . Nausea and vomiting     Past Surgical History:  Procedure Laterality Date  . kidney biopsy  03/2013   IgA nephropathy with mesangial hyperscellularity, mild acute tubulo-interstital nephritis   . no past suregeries      Social History   Tobacco Use  . Smoking status: Never Smoker  . Smokeless tobacco: Never Used  Substance Use Topics  . Alcohol use: No  . Drug use: No    Family History  Problem Relation Age of Onset  . Hypertension Father   . Diabetes Maternal Grandmother   . Diabetes Paternal Grandfather     No Known Allergies  Medication list has been reviewed and updated.  Current Outpatient Medications on File Prior to Visit  Medication Sig Dispense Refill  . lisinopril (PRINIVIL,ZESTRIL) 30 MG tablet Take 30 mg by mouth daily.    . mycophenolate (CELLCEPT)  500 MG tablet Take 500 mg by mouth 2 (two) times daily.    . Omega-3 Fatty Acids (FISH OIL) 1000 MG CAPS Take by mouth.    . Vitamin D, Ergocalciferol, (DRISDOL) 50000 UNITS CAPS capsule Take 50,000 Units by mouth every 7 (seven) days.     Current Facility-Administered Medications on File Prior to Visit  Medication Dose Route Frequency Provider Last Rate Last Dose  . albuterol (PROVENTIL) (2.5 MG/3ML) 0.083% nebulizer solution 2.5 mg  2.5 mg Nebulization Once Weber, Sarah L, PA-C        ROS ROS otherwise unremarkable unless listed above.  Physical Examination: BP 122/72   Pulse 78   Temp 98.1 F (36.7 C) (Oral)   Resp 17   Ht 5\' 10"  (1.778 m)   Wt 139 lb (63 kg)   SpO2 98%   BMI 19.94 kg/m  Ideal Body Weight: Weight in (lb) to have BMI = 25: 173.9  Physical Exam  Constitutional: He is oriented to person, place, and time. He appears well-developed and well-nourished. No distress.  HENT:  Head: Atraumatic.  Right Ear: Tympanic membrane, external ear and ear canal normal.  Left Ear: Tympanic membrane, external ear and ear canal normal.  Nose: Mucosal edema (dry mucus at the nasal turbinates) and rhinorrhea present.  Right sinus exhibits no maxillary sinus tenderness and no frontal sinus tenderness. Left sinus exhibits no maxillary sinus tenderness and no frontal sinus tenderness.  Mouth/Throat: No uvula swelling. Posterior oropharyngeal erythema (mildly red) present. No oropharyngeal exudate or posterior oropharyngeal edema. No tonsillar exudate.  Eyes: Conjunctivae, EOM and lids are normal. Pupils are equal, round, and reactive to light. Right eye exhibits normal extraocular motion. Left eye exhibits normal extraocular motion.  Neck: Trachea normal and full passive range of motion without pain. No edema and no erythema present.  Cardiovascular: Normal rate.  Pulmonary/Chest: Effort normal. No respiratory distress. He has no decreased breath sounds. He has no wheezes. He has no  rhonchi.  Neurological: He is alert and oriented to person, place, and time.  Skin: Skin is warm and dry. He is not diaphoretic.  Psychiatric: He has a normal mood and affect. His behavior is normal.     Assessment and Plan: Seth Jimenez is a 22 y.o. male who is here today for cc of  Chief Complaint  Patient presents with  . Cough  starting to worsen.  Advised of supportive treatment but will treat today.  He is taking Cellcept which can cause more RI frequency then non-Cellcept users, and he is declining.    Acute upper respiratory infection - Plan: benzonatate (TESSALON) 100 MG capsule, Guaifenesin (MUCINEX MAXIMUM STRENGTH) 1200 MG TB12, ipratropium (ATROVENT) 0.03 % nasal spray, amoxicillin (AMOXIL) 500 MG capsule  Trena Platt, PA-C [facility] 1/16/20195:07 PM

## 2018-01-08 ENCOUNTER — Encounter: Payer: Self-pay | Admitting: Physician Assistant

## 2019-02-13 ENCOUNTER — Other Ambulatory Visit: Payer: Self-pay

## 2019-02-13 ENCOUNTER — Encounter: Payer: Self-pay | Admitting: Family Medicine

## 2019-02-13 ENCOUNTER — Ambulatory Visit: Payer: 59 | Admitting: Family Medicine

## 2019-02-13 VITALS — BP 129/83 | HR 81 | Temp 98.3°F | Resp 18 | Ht 67.72 in | Wt 129.4 lb

## 2019-02-13 DIAGNOSIS — N028 Recurrent and persistent hematuria with other morphologic changes: Secondary | ICD-10-CM

## 2019-02-13 DIAGNOSIS — R112 Nausea with vomiting, unspecified: Secondary | ICD-10-CM | POA: Diagnosis not present

## 2019-02-13 LAB — POCT URINALYSIS DIP (CLINITEK)
Bilirubin, UA: NEGATIVE
Glucose, UA: NEGATIVE mg/dL
Leukocytes, UA: NEGATIVE
Nitrite, UA: NEGATIVE
POC PROTEIN,UA: >=300 — AB
Spec Grav, UA: >=1.03 — AB (ref 1.010–1.025)
Urobilinogen, UA: 0.2 E.U./dL
pH, UA: 6.5 (ref 5.0–8.0)

## 2019-02-13 MED ORDER — OMEPRAZOLE 40 MG PO CPDR: 40.0000 mg | DELAYED_RELEASE_CAPSULE | Freq: Every day | ORAL | 3 refills | Status: DC

## 2019-02-13 NOTE — Progress Notes (Signed)
Acute Office Visit  Subjective:    Patient ID: Seth Jimenez, male    DOB: 1996/06/30, 23 y.o.   MRN: [id number]  Cc: Emesis X 1 mth- pt states it's after he eat   HPI Patient is in today for vomiting after eating nightly for a month. Pt states he has not had diarrhea associated with vomiting. Pt states no one is sick in the home. Pt has not had a h/o n/v in the past. Pt is not taking otc medications for this concern. Pt currently lives with parents-"taking a break from school" pt was in final year in biology and has experienced difficulty passing classes needed to graduate.  Pt states he is looking at a different major when school back in session in the fall. Pt was previously working at [employer] -now closed due to COVID. Pt with IgA nephropathy-pt states he is taking NO medication and has not seen his nephrologist-Dr. Hyman H[facility] at [facility]. Pt previously taking ACE for mild elevated bp-stopped due to elevated Cr at 8/19 visit per nephro note.  Pt states previously diagnosed with bipolar disorder-parents did not want pt to take medication for this diagnosis. 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   Past Medical History:  Diagnosis Date  . Bronchitis, acute   . HTN (hypertension)   . Kidney disease    IgA nephropathy  . Nausea and vomiting     Past Surgical History:  Procedure Laterality Date  . kidney biopsy  03/2013   IgA nephropathy with mesangial hyperscellularity, mild acute tubulo-interstital nephritis   . no past suregeries      Family History  Problem Relation Age of Onset  . Hypertension Father   . Diabetes Maternal Grandmother   . Diabetes Paternal Grandfather     Social History   Socioeconomic History  . Marital status: Single    Spouse name: Not on file  . Number of children: Not on file  . Years of education: Not on file  . Highest education level: Not on file  Occupational History  . Not  on file  Social Needs  . Financial resource strain: Not on file  . Food insecurity:    Worry: Not on file    Inability: Not on file  . Transportation needs:    Medical: Not on file    Non-medical: Not on file  Tobacco Use  . Smoking status: Never Smoker  . Smokeless tobacco: Never Used  Substance and Sexual Activity  . Alcohol use: No  . Drug use: No  . Sexual activity: Never  Lifestyle  . Physical activity:    Days per week: Not on file    Minutes per session: Not on file  . Stress: Not on file  Relationships  . Social connections:    Talks on phone: Not on file    Gets together: Not on file    Attends religious service: Not on file    Active member of club or organization: Not on file    Attends meetings of clubs or organizations: Not on file    Relationship status: Not on file  . Intimate partner violence:    Fear of current or ex partner: Not on file    Emotionally abused: Not on file    Physically abused: Not on file    Forced sexual activity: Not on file  Other Topics Concern  . 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 benzonatate (TESSALON) 100 MG capsule Take 1-2 capsules (100-200 mg total) by mouth 3 (three) times daily as needed for cough. 40 capsule 0  . Guaifenesin (MUCINEX MAXIMUM STRENGTH) 1200 MG TB12 Take 1 tablet (1,200 mg total) by mouth every 12 (twelve) hours as needed. 14 tablet 1  . ipratropium (ATROVENT) 0.03 % nasal spray Place 2 sprays into both nostrils 2 (two) times daily. 30 mL 0  . lisinopril (PRINIVIL,ZESTRIL) 30 MG tablet Take 30 mg by mouth daily.    . mycophenolate (CELLCEPT) 500 MG tablet Take 500 mg by mouth 2 (two) times daily.    . Omega-3 Fatty Acids (FISH OIL) 1000 MG CAPS Take by mouth.    . Vitamin D, Ergocalciferol, (DRISDOL) 50000 UNITS CAPS capsule Take 50,000 Units by mouth every 7 (seven) days.      Facility-Administered Medications Prior to Visit  Medication Dose Route Frequency Provider Last Rate Last Dose  . albuterol (PROVENTIL) (2.5 MG/3ML) 0.083% nebulizer solution 2.5 mg  2.5 mg Nebulization Once Weber, Sarah L, PA-C        No Known Allergies  ROS CONSTITUTIONAL: no weight loss, or fever EENT:no  sinus problems, no nasal congestion CV: no chest pain RESP: no SOB, no cough GI:  abdominal pain with nausea and vomiting, NO blood in stool, NO bowel changes GU: no pain with urination,  H/o of IgA nephropathy with no current medications NEURO: NO headaches PSY: I depression, and mood swings-not currently treated    Objective:    Physical Exam  Constitutional: He is oriented to person, place, and time.  HENT:  Head: Normocephalic and atraumatic.  Right Ear: External ear normal.  Left Ear: External ear normal.  Nose: Nose normal.  Mouth/Throat: Oropharynx is clear and moist.  Eyes: Conjunctivae are normal.  Neck: Normal range of motion. Neck supple. No thyromegaly present.  Cardiovascular: Normal rate, regular rhythm and normal heart sounds.  Pulmonary/Chest: Effort normal and breath sounds normal.  Abdominal: Soft. Bowel sounds are normal.  Musculoskeletal: Normal range of motion.  Lymphadenopathy:    He has no cervical adenopathy.  Neurological: He is alert and oriented to person, place, and time. He has normal reflexes.    Vitals:   02/13/19 1344  BP: 129/83  Pulse: 81  Resp: 18  Temp: 98.3 F (36.8 C)  SpO2: 95%  weight 129-10 lb weight loss from 1+ year ago Wt Readings from Last 3 Encounters:  10/25/17 139 lb (63 kg)  04/16/15 132 lb (59.9 kg) (19 %, Z= -0.88)*  03/10/15 130 lb 12.8 oz (59.3 kg) (18 %, Z= -0.93)*   * Growth percentiles are based on CDC (Boys, 2-20 Years) data.    Health Maintenance Due  Topic Date Due  . HIV Screening  09/21/2011  . TETANUS/TDAP  04/29/2018    Lab Results  Component Value Date   TSH 1.255 09/05/2012   Lab  Results  Component Value Date   WBC 6.3 02/25/2015   HGB 15.7 02/25/2015   HCT 45.3 02/25/2015   MCV 85.2 02/25/2015   PLT 222 02/25/2015   Lab Results  Component Value Date   NA 137 02/25/2015   K 3.8 02/25/2015   CO2 28 02/25/2015   GLUCOSE 95 02/25/2015   BUN 11 02/25/2015   CREATININE 1.07 02/25/2015   BILITOT 0.8 02/25/2015   ALKPHOS 79 02/25/2015   AST  22 02/25/2015   ALT 15 (L) 02/25/2015   PROT 6.9 02/25/2015   ALBUMIN 3.8 02/25/2015   CALCIUM 9.3 02/25/2015   ANIONGAP 7 02/25/2015   Lab Results  Component Value Date   CHOL 188 04/16/2015   Lab Results  Component Value Date   HDL 62.30 04/16/2015   Lab Results  Component Value Date   LDLCALC 109 (H) 04/16/2015   Lab Results  Component Value Date   TRIG 84.0 04/16/2015   Lab Results  Component Value Date   CHOLHDL 3 04/16/2015   No results found for: HGBA1C     Assessment & Plan:   Non-intractable vomiting with nausea, unspecified vomiting type - Plan: CBC with Differential, Comprehensive metabolic panel, Amylase, Lipase, POCT URINALYSIS DIP (CLINITEK), Urine Culture, CANCELED: POCT Microscopic Urinalysis ([facility]) omeprazole-rx-concern for gastritis vs gallstones-ultrasound discussed if medication does not improve symptoms  Pt with h/o IgA nephropathy-concern for no medication with mild elevation in bp-urinalysis d/w pt-proteinuria and hematuria-blood work pending for renal function-advised to schedule appt with nephro  Concern for depression-d/w pt-weight loss, change in activities-loss of friends and social activities. Pt declines treatment-f/u in 2 weeks to further discuss   Loui Massenburg Mat CarneLEIGH Jerek Meulemans, MD

## 2019-02-13 NOTE — Patient Instructions (Addendum)
  Please make follow up appointment with nephrology to resume medication Consider treatment for depression Start taking omeprazole 40mg  daily If symptoms do not improve with medication, please call for referral for ultrasound We will call you about your lab results    If you have lab work done today you will be contacted with your lab results within the next 2 weeks.  If you have not heard from [place] then please contact us. The fastest way to get your results is to register for My Chart.   IF you received an x-ray today, you will receive an invoice from [facility]. Please contact [facility] at [phone] with questions or concerns regarding your invoice.   IF you received labwork today, you will receive an invoice from [facility]. Please contact [facility] at [phone] with questions or concerns regarding your invoice.   Our billing staff will not be able to assist you with questions regarding bills from these companies.  You will be contacted with the lab results as soon as they are available. The fastest way to get your results is to activate your My Chart account. Instructions are located on the last page of this paperwork. If you have not heard from [place] regarding the results in 2 weeks, please contact this office.

## 2019-02-14 LAB — CBC WITH DIFFERENTIAL/PLATELET
Basophils Absolute: 0.1 10*3/uL (ref 0.0–0.2)
Basos: 1 %
EOS (ABSOLUTE): 0.5 10*3/uL — ABNORMAL HIGH (ref 0.0–0.4)
Eos: 5 %
Hematocrit: 49.3 % (ref 37.5–51.0)
Hemoglobin: 17.1 g/dL (ref 13.0–17.7)
Immature Grans (Abs): 0 10*3/uL (ref 0.0–0.1)
Immature Granulocytes: 0 %
Lymphocytes Absolute: 3.2 10*3/uL — ABNORMAL HIGH (ref 0.7–3.1)
Lymphs: 34 %
MCH: 29.7 pg (ref 26.6–33.0)
MCHC: 34.7 g/dL (ref 31.5–35.7)
MCV: 86 fL (ref 79–97)
Monocytes Absolute: 0.8 10*3/uL (ref 0.1–0.9)
Monocytes: 8 %
Neutrophils Absolute: 4.9 10*3/uL (ref 1.4–7.0)
Neutrophils: 52 %
Platelets: 269 10*3/uL (ref 150–450)
RBC: 5.75 x10E6/uL (ref 4.14–5.80)
RDW: 13 % (ref 11.6–15.4)
WBC: 9.5 10*3/uL (ref 3.4–10.8)

## 2019-02-14 LAB — COMPREHENSIVE METABOLIC PANEL
ALT: 7 IU/L (ref 0–44)
AST: 16 IU/L (ref 0–40)
Albumin/Globulin Ratio: 1.9 (ref 1.2–2.2)
Albumin: 4.9 g/dL (ref 4.1–5.2)
Alkaline Phosphatase: 64 IU/L (ref 39–117)
BUN/Creatinine Ratio: 13 (ref 9–20)
BUN: 14 mg/dL (ref 6–20)
Bilirubin Total: 0.7 mg/dL (ref 0.0–1.2)
CO2: 26 mmol/L (ref 20–29)
Calcium: 9.7 mg/dL (ref 8.7–10.2)
Chloride: 104 mmol/L (ref 96–106)
Creatinine, Ser: 1.12 mg/dL (ref 0.76–1.27)
GFR calc Af Amer: 107 mL/min/{1.73_m2} (ref >59)
GFR calc non Af Amer: 93 mL/min/{1.73_m2} (ref >59)
Globulin, Total: 2.6 g/dL (ref 1.5–4.5)
Glucose: 74 mg/dL (ref 65–99)
Potassium: 4 mmol/L (ref 3.5–5.2)
Sodium: 146 mmol/L — ABNORMAL HIGH (ref 134–144)
Total Protein: 7.5 g/dL (ref 6.0–8.5)

## 2019-02-14 LAB — LIPASE: Lipase: 61 U/L (ref 13–78)

## 2019-02-14 LAB — AMYLASE: Amylase: 92 U/L (ref 31–110)

## 2019-02-15 ENCOUNTER — Encounter: Payer: Self-pay | Admitting: Family Medicine

## 2019-02-15 DIAGNOSIS — R1110 Vomiting, unspecified: Secondary | ICD-10-CM | POA: Insufficient documentation

## 2019-02-15 LAB — URINE CULTURE

## 2019-02-15 NOTE — Progress Notes (Signed)
Pt mailbox is full, will send letter

## 2019-02-27 ENCOUNTER — Ambulatory Visit: Payer: 59 | Admitting: Family Medicine

## 2019-03-05 ENCOUNTER — Other Ambulatory Visit: Payer: Self-pay

## 2019-03-05 ENCOUNTER — Encounter: Payer: Self-pay | Admitting: Family Medicine

## 2019-03-05 ENCOUNTER — Ambulatory Visit: Payer: 59 | Admitting: Family Medicine

## 2019-03-05 VITALS — BP 124/81 | HR 79 | Temp 98.7°F | Resp 16 | Ht 67.0 in | Wt 131.0 lb

## 2019-03-05 DIAGNOSIS — R112 Nausea with vomiting, unspecified: Secondary | ICD-10-CM

## 2019-03-05 DIAGNOSIS — N028 Recurrent and persistent hematuria with other morphologic changes: Secondary | ICD-10-CM | POA: Diagnosis not present

## 2019-03-05 NOTE — Patient Instructions (Signed)
° ° ° °  If you have lab work done today you will be contacted with your lab results within the next 2 weeks.  If you have not heard from us then please contact us. The fastest way to get your results is to register for My Chart. ° ° °IF you received an x-ray today, you will receive an invoice from [facility]. Please contact [facility] at [phone] with questions or concerns regarding your invoice.  ° °IF you received labwork today, you will receive an invoice from [facility]. Please contact [facility] at [phone] with questions or concerns regarding your invoice.  ° °Our billing staff will not be able to assist you with questions regarding bills from these companies. ° °You will be contacted with the lab results as soon as they are available. The fastest way to get your results is to activate your My Chart account. Instructions are located on the last page of this paperwork. If you have not heard from us regarding the results in 2 weeks, please contact this office. °  ° ° ° °

## 2019-03-05 NOTE — Progress Notes (Signed)
Acute Office Visit  Subjective:    Patient ID: Seth Jimenez, male    DOB: 1996/03/21, 23 y.o.   MRN: [id number]  Chief Complaint  Patient presents with  . Emesis    follow-up from 5/6 pt states he still has the sane symptoms with no change. Pt states he still has the N/V     HPI Patient is in today for continue nausea and vomiting. Pt states he is using PPI intermittently with no improvement in symptoms. Pt has not made appt for nephro appt to follow up on labwork and restart medications. Pt states no urinary concerns. Pt with nausea and vomiting daily-bile , no food. Pt does feel related to food intake. Pt with no blood noted in vomit. Pt with no counseling since last visit and does not want to start medication for depression. Pt states he will restart school in the fall and get counseling at school. Pt will restart work this week .  Past Medical History:  Diagnosis Date  . Bronchitis, acute   . HTN (hypertension)   . Kidney disease    IgA nephropathy  . Nausea and vomiting     Past Surgical History:  Procedure Laterality Date  . kidney biopsy  03/2013   IgA nephropathy with mesangial hyperscellularity, mild acute tubulo-interstital nephritis   . no past suregeries      Family History  Problem Relation Age of Onset  . Hypertension Father   . Diabetes Maternal Grandmother   . Diabetes Paternal Grandfather     Social History   Socioeconomic History  . Marital status: Single    Spouse name: Not on file  . Number of children: Not on file  . Years of education: Not on file  . Highest education level: Not on file  Occupational History  . Not on file  Social Needs  . Financial resource strain: Not on file  . Food insecurity:    Worry: Not on file    Inability: Not on file  . Transportation needs:    Medical: Not on file    Non-medical: Not on file  Tobacco Use  . Smoking status: Never Smoker  . Smokeless tobacco: Never Used  Substance and Sexual Activity  .  Alcohol use: No  . Drug use: No  . Sexual activity: Never  Lifestyle  . Physical activity:    Days per week: Not on file    Minutes per session: Not on file  . Stress: Not on file  Relationships  . Social connections:    Talks on phone: Not on file    Gets together: Not on file    Attends religious service: Not on file    Active member of club or organization: Not on file    Attends meetings of clubs or organizations: Not on file    Relationship status: Not on file  . Intimate partner violence:    Fear of current or ex partner: Not on file    Emotionally abused: Not on file    Physically abused: Not on file    Forced sexual activity: Not on file  Other Topics Concern  . 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 Omega-3 Fatty Acids (FISH OIL) 1000 MG CAPS Take by mouth.    Marland Kitchen omeprazole (PRILOSEC) 40 MG capsule Take 1 capsule (40 mg total) by  mouth daily. 30 capsule 3  . Vitamin D, Ergocalciferol, (DRISDOL) 50000 UNITS CAPS capsule Take 50,000 Units by mouth every 7 (seven) days.    . benzonatate (TESSALON) 100 MG capsule Take 1-2 capsules (100-200 mg total) by mouth 3 (three) times daily as needed for cough. (Patient not taking: Reported on 02/13/2019) 40 capsule 0  . Guaifenesin (MUCINEX MAXIMUM STRENGTH) 1200 MG TB12 Take 1 tablet (1,200 mg total) by mouth every 12 (twelve) hours as needed. (Patient not taking: Reported on 02/13/2019) 14 tablet 1  . ipratropium (ATROVENT) 0.03 % nasal spray Place 2 sprays into both nostrils 2 (two) times daily. (Patient not taking: Reported on 02/13/2019) 30 mL 0  . lisinopril (PRINIVIL,ZESTRIL) 30 MG tablet Take 30 mg by mouth daily.    . mycophenolate (CELLCEPT) 500 MG tablet Take 500 mg by mouth 2 (two) times daily.     Facility-Administered Medications Prior to Visit  Medication Dose Route Frequency Provider Last Rate Last Dose   . albuterol (PROVENTIL) (2.5 MG/3ML) 0.083% nebulizer solution 2.5 mg  2.5 mg Nebulization Once Weber, Sarah L, PA-C        No Known Allergies  Review of Systems  Constitutional: Negative for fever.  Respiratory: Negative for cough.   Cardiovascular: Negative for chest pain.  Gastrointestinal: Positive for abdominal pain, heartburn, nausea and vomiting. Negative for blood in stool, constipation, diarrhea and melena.  Genitourinary: Negative for dysuria, flank pain, frequency, hematuria and urgency.       Objective:    Physical Exam  Constitutional: He appears well-developed. No distress.  HENT:  Head: Normocephalic and atraumatic.  Cardiovascular: Normal rate and regular rhythm.  Pulmonary/Chest: Effort normal and breath sounds normal.  Abdominal: Soft. Bowel sounds are normal. He exhibits no distension. There is no abdominal tenderness. There is no rebound and no guarding.    BP 124/81   Pulse 79   Temp 98.7 F (37.1 C) (Oral)   Resp 16   Ht 5\' 7"  (1.702 m)   Wt 131 lb (59.4 kg)   SpO2 98%   BMI 20.52 kg/m  Wt Readings from Last 3 Encounters:  03/05/19 131 lb (59.4 kg)  02/13/19 129 lb 6.4 oz (58.7 kg)  10/25/17 139 lb (63 kg)     Lab Results  Component Value Date   TSH 1.255 09/05/2012   Lab Results  Component Value Date   WBC 9.5 02/13/2019   HGB 17.1 02/13/2019   HCT 49.3 02/13/2019   MCV 86 02/13/2019   PLT 269 02/13/2019   Lab Results  Component Value Date   NA 146 (H) 02/13/2019   K 4.0 02/13/2019   CO2 26 02/13/2019   GLUCOSE 74 02/13/2019   BUN 14 02/13/2019   CREATININE 1.12 02/13/2019   BILITOT 0.7 02/13/2019   ALKPHOS 64 02/13/2019   AST 16 02/13/2019   ALT 7 02/13/2019   PROT 7.5 02/13/2019   ALBUMIN 4.9 02/13/2019   CALCIUM 9.7 02/13/2019   ANIONGAP 7 02/25/2015   Lab Results  Component Value Date   CHOL 188 04/16/2015   Lab Results  Component Value Date   HDL 62.30 04/16/2015   Lab Results  Component Value Date   LDLCALC  109 (H) 04/16/2015   Lab Results  Component Value Date   TRIG 84.0 04/16/2015   Lab Results  Component Value Date   CHOLHDL 3 04/16/2015   No results found for: HGBA1C     Assessment & Plan:   Problem List Items Addressed This Visit  Digestive   Non-intractable vomiting - Primary   Relevant Orders   US Abdomen Limited RUQ      Genitourinary   IgA nephropathy   Relevant Orders   Ambulatory referral to Nephrology     If ultrasound negative-GI referral for n/v-reviewed labwork with pt-agrees to nephro referral Declines meds for depression, counseling referral  LISA Mat Carne, MD

## 2019-03-26 ENCOUNTER — Other Ambulatory Visit: Payer: Self-pay

## 2019-03-26 ENCOUNTER — Encounter: Payer: Self-pay | Admitting: Family

## 2019-03-26 ENCOUNTER — Ambulatory Visit (INDEPENDENT_AMBULATORY_CARE_PROVIDER_SITE_OTHER): Payer: 59 | Admitting: Family

## 2019-03-26 DIAGNOSIS — N028 Recurrent and persistent hematuria with other morphologic changes: Secondary | ICD-10-CM

## 2019-03-26 DIAGNOSIS — R112 Nausea with vomiting, unspecified: Secondary | ICD-10-CM | POA: Diagnosis not present

## 2019-03-26 NOTE — Progress Notes (Signed)
Subjective:    Patient ID: Seth Jimenez, male    DOB: [birth year], 23 y.o.   MRN: [id number]  HPI  Virtual Visit via Video Note  I connected with Seth Jimenez on 03/27/19 at  2:40 PM EDT by a video enabled telemedicine application and verified that I am speaking with the correct person using two identifiers.  Location: Patient: home Provider: work   I discussed the limitations of evaluation and management by telemedicine and the availability of in person appointments. The patient expressed understanding and agreed to proceed.   Patient is a 23 yr old male who presents today to re-establish care. He had a visit to the urgent care for nausea and vomiting on 03/05/19. They prescribed omeprazole which he stopped because he did not think that it was helping.   Reports daily vomiting about one time a day.  Does not happen at the same time each day. Started about 2 months ago.  No worsening or alleviating factors. He denies associated abdominal pain.  Denies changes in stools. Denies diarrhea, black or bloody stools. Denies unusual stress.   Denies anxiety.  He did report that something happened "but its over now." Did not wish to elaborate further.    Sees [facility] for his history of IgA nephropathy.   Patient notes no improvement with omeprazole.    Reports weight stable.   Review of Systems  Constitutional: Negative for unexpected weight change.  HENT: Negative for rhinorrhea.   Eyes: Negative for visual disturbance.  Respiratory: Negative for cough.   Cardiovascular: Negative for leg swelling.  Gastrointestinal:       See HPI  Genitourinary: Negative for dysuria, frequency and hematuria.  Musculoskeletal: Negative for arthralgias and myalgias.  Skin: Negative for rash.  Neurological: Negative for headaches.  Hematological: Negative for adenopathy.  Psychiatric/Behavioral:       See HPI   Past Medical History:  Diagnosis Date  . Bronchitis, acute   .  HTN (hypertension)   . Kidney disease    IgA nephropathy  . Nausea and vomiting      Social History   Socioeconomic History  . Marital status: Single    Spouse name: Not on file  . Number of children: Not on file  . Years of education: Not on file  . Highest education level: Not on file  Occupational History  . Not on file  Social Needs  . Financial resource strain: Not on file  . Food insecurity    Worry: Not on file    Inability: Not on file  . Transportation needs    Medical: Not on file    Non-medical: Not on file  Tobacco Use  . Smoking status: Never Smoker  . Smokeless tobacco: Never Used  Substance and Sexual Activity  . Alcohol use: No  . Drug use: No  . Sexual activity: Never  Lifestyle  . Physical activity    Days per week: Not on file    Minutes per session: Not on file  . Stress: Not on file  Relationships  . Social Musicianconnections    Talks on phone: Not on file    Gets together: Not on file    Attends religious service: Not on file    Active member of club or organization: Not on file    Attends meetings of clubs or organizations: Not on file    Relationship status: Not on file  . Intimate partner violence    Fear of current  or ex partner: Not on file    Emotionally abused: Not on file    Physically abused: Not on file    Forced sexual activity: Not on file  Other Topics Concern  . 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   Lives with Mom, dad, 2 younger brothers and brother's girlfriend.   Enjoys drawing, video games   2 dogs        Past Surgical History:  Procedure Laterality Date  . kidney biopsy  03/2013   IgA nephropathy with mesangial hyperscellularity, mild acute tubulo-interstital nephritis   . no past suregeries      Family History  Problem Relation Age of Onset  . Hypertension Father   . Diabetes Maternal Grandmother   . Diabetes Paternal Grandfather     No Known Allergies  Current  Outpatient Medications on File Prior to Visit  Medication Sig Dispense Refill  . Omega-3 Fatty Acids (FISH OIL) 1000 MG CAPS Take by mouth.    Marland Kitchen omeprazole (PRILOSEC) 40 MG capsule Take 1 capsule (40 mg total) by mouth daily. 30 capsule 3  . Vitamin D, Ergocalciferol, (DRISDOL) 50000 UNITS CAPS capsule Take 50,000 Units by mouth every 7 (seven) days.     Current Facility-Administered Medications on File Prior to Visit  Medication Dose Route Frequency Provider Last Rate Last Dose  . albuterol (PROVENTIL) (2.5 MG/3ML) 0.083% nebulizer solution 2.5 mg  2.5 mg Nebulization Once Weber, Sarah L, PA-C        There were no vitals taken for this visit.      Objective:   Physical Exam Constitutional:      General: He is not in acute distress.    Appearance: He is well-developed.  HENT:     Head: Normocephalic and atraumatic.  Eyes:     Conjunctiva/sclera:     Right eye: Right conjunctiva is not injected.     Left eye: Left conjunctiva is not injected.  Pulmonary:     Effort: Pulmonary effort is normal. No respiratory distress.     Breath sounds: No wheezing or rales.  Skin:    Coloration: Skin is not jaundiced.  Neurological:     Mental Status: He is alert and oriented to person, place, and time.  Psychiatric:        Behavior: Behavior normal.        Thought Content: Thought content normal.        Cognition and Memory: Cognition and memory normal.     Comments: Poor eye contact           Assessment & Plan:  Nausea/vomitting- will obtain abdominal US and refer to GI for further evaluation. I wonder if there may be a stress related component since he stated that something happened but declined to discuss any further?  His mother was present at the time of the visit so maybe he will be more willing to talk in person next visit when he is alone.   IgA Nephropathy- reports stable. Management per [place].

## 2019-04-04 ENCOUNTER — Other Ambulatory Visit: Payer: Self-pay

## 2019-04-04 ENCOUNTER — Ambulatory Visit (HOSPITAL_BASED_OUTPATIENT_CLINIC_OR_DEPARTMENT_OTHER)
Admission: RE | Admit: 2019-04-04 | Discharge: 2019-04-04 | Disposition: A | Discharge status: Home / Self Care | Payer: 59 | Source: Ambulatory Visit | Attending: Family | Admitting: Family

## 2019-04-04 DIAGNOSIS — R112 Nausea with vomiting, unspecified: Secondary | ICD-10-CM | POA: Insufficient documentation

## 2019-04-05 ENCOUNTER — Telehealth: Payer: Self-pay | Admitting: Family

## 2019-04-05 NOTE — Telephone Encounter (Signed)
Called patient a few times but n/a and mailbox was full.

## 2019-04-05 NOTE — Telephone Encounter (Signed)
Please let pt know that his ultrasound is normal except for mild kidney abnormality which is due to his history of kidney problems.  How is his abdominal pain? If he is still having abdominal pain I would like to see him in the office for a face to face visit.

## 2019-04-08 NOTE — Telephone Encounter (Signed)
Per patient "his abdominal pain is better" I advised patient if pain returns or gets worst to call for a face to face evaluation.

## 2020-02-04 ENCOUNTER — Other Ambulatory Visit: Payer: Self-pay

## 2020-02-04 ENCOUNTER — Encounter: Payer: Self-pay | Admitting: Family

## 2020-02-04 ENCOUNTER — Ambulatory Visit (INDEPENDENT_AMBULATORY_CARE_PROVIDER_SITE_OTHER): Payer: 59 | Admitting: Family

## 2020-02-04 VITALS — BP 134/80 | HR 98 | Temp 98.3°F | Resp 16 | Ht 67.0 in | Wt 131.0 lb

## 2020-02-04 DIAGNOSIS — K625 Hemorrhage of anus and rectum: Secondary | ICD-10-CM

## 2020-02-04 NOTE — Progress Notes (Signed)
Subjective:    Patient ID: Seth Jimenez, male    DOB: 06-10-1996, 24 y.o.   MRN: [id number]  HPI  Patient is a 24 yr old male who presents today with chief complaint of blood in his stool.  First noted blood in the stool 1 week ago.  Blood is noted with wiping and in the toilet bowl after bowel movements.  He denies abdominal pain. Denies fatigue/palpitations/sob.  Denies family hx of colon cancer.    Review of Systems See HPI  Past Medical History:  Diagnosis Date  . Bronchitis, acute   . HTN (hypertension)   . Kidney disease    IgA nephropathy  . Nausea and vomiting      Social History   Socioeconomic History  . Marital status: Single    Spouse name: Not on file  . Number of children: Not on file  . Years of education: Not on file  . Highest education level: Not on file  Occupational History  . Not on file  Tobacco Use  . Smoking status: Never Smoker  . Smokeless tobacco: Never Used  Substance and Sexual Activity  . Alcohol use: No  . Drug use: No  . Sexual activity: Never  Other Topics Concern  . 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   Lives with Mom, dad, 2 younger brothers and brother's girlfriend.   Enjoys drawing, video games   2 dogs       Social Determinants of Health   Financial Resource Strain:   . Difficulty of Paying Living Expenses:   Food Insecurity:   . Worried About Charity fundraiser in the Last Year:   . Arboriculturist in the Last Year:   Transportation Needs:   . Film/video editor (Medical):   Marland Kitchen Lack of Transportation (Non-Medical):   Physical Activity:   . Days of Exercise per Week:   . Minutes of Exercise per Session:   Stress:   . Feeling of Stress :   Social Connections:   . Frequency of Communication with Friends and Family:   . Frequency of Social Gatherings with Friends and Family:   . Attends Religious Services:   . Active Member of Clubs or Organizations:   . Attends  Archivist Meetings:   Marland Kitchen Marital Status:   Intimate Partner Violence:   . Fear of Current or Ex-Partner:   . Emotionally Abused:   Marland Kitchen Physically Abused:   . Sexually Abused:     Past Surgical History:  Procedure Laterality Date  . kidney biopsy  03/2013   IgA nephropathy with mesangial hyperscellularity, mild acute tubulo-interstital nephritis   . no past suregeries      Family History  Problem Relation Age of Onset  . Hypertension Father   . Diabetes Maternal Grandmother   . Diabetes Paternal Grandfather     No Known Allergies  No current outpatient medications on file prior to visit.   No current facility-administered medications on file prior to visit.    BP 134/80 (BP Location: Right Arm, Patient Position: Sitting, Cuff Size: Small)   Pulse 98   Temp 98.3 F (36.8 C) (Temporal)   Resp 16   Ht 5\' 7"  (1.702 m)   Wt 131 lb (59.4 kg)   SpO2 100%   BMI 20.52 kg/m       Objective:   Physical Exam Constitutional:      General: He is not  in acute distress.    Appearance: He is well-developed.  HENT:     Head: Normocephalic and atraumatic.  Cardiovascular:     Rate and Rhythm: Normal rate and regular rhythm.     Heart sounds: No murmur.  Pulmonary:     Effort: Pulmonary effort is normal. No respiratory distress.     Breath sounds: Normal breath sounds. No wheezing or rales.  Abdominal:     General: There is no distension.     Palpations: Abdomen is soft.     Tenderness: There is no abdominal tenderness.  Genitourinary:    Rectum: No mass, tenderness, anal fissure, external hemorrhoid or internal hemorrhoid. Normal anal tone.     Comments: + bright red blood noted in rectum on exam. Skin:    General: Skin is warm and dry.  Neurological:     Mental Status: He is alert and oriented to person, place, and time.  Psychiatric:        Behavior: Behavior normal.        Thought Content: Thought content normal.           Assessment & Plan:  Rectal  bleeding- cbc is normal.  No obvious cause noted on digital rectal exam. Will likely need colonoscopy for further evaluation. Will refer to GI for further evaluation. Patient is advised to Go to the ER if you develop severe/worsening rectal bleeding, weakness or shortness of breat.  This visit occurred during the SARS-CoV-2 public health emergency.  Safety protocols were in place, including screening questions prior to the visit, additional usage of staff PPE, and extensive cleaning of exam room while observing appropriate contact time as indicated for disinfecting solutions.

## 2020-02-04 NOTE — Patient Instructions (Signed)
You should be contacted about your referral to GI to further evaluate your rectal bleeding. Go to the ER if you develop severe/worsening rectal bleeding, weakness or shortness of breat. Please schedule a lab appointment for another day this week.

## 2020-02-05 ENCOUNTER — Other Ambulatory Visit (INDEPENDENT_AMBULATORY_CARE_PROVIDER_SITE_OTHER): Payer: 59

## 2020-02-05 ENCOUNTER — Encounter: Payer: Self-pay | Admitting: Physician Assistant

## 2020-02-05 DIAGNOSIS — K625 Hemorrhage of anus and rectum: Secondary | ICD-10-CM | POA: Diagnosis not present

## 2020-02-06 ENCOUNTER — Telehealth: Payer: Self-pay | Admitting: Family

## 2020-02-06 ENCOUNTER — Encounter: Payer: Self-pay | Admitting: Family

## 2020-02-06 LAB — CBC WITH DIFFERENTIAL/PLATELET
Basophils Absolute: 0 10*3/uL (ref 0.0–0.1)
Basophils Relative: 0.7 % (ref 0.0–3.0)
Eosinophils Absolute: 0.2 10*3/uL (ref 0.0–0.7)
Eosinophils Relative: 3.7 % (ref 0.0–5.0)
HCT: 42.3 % (ref 39.0–52.0)
Hemoglobin: 14.8 g/dL (ref 13.0–17.0)
Lymphocytes Relative: 23.9 % (ref 12.0–46.0)
Lymphs Abs: 1.5 10*3/uL (ref 0.7–4.0)
MCHC: 34.9 g/dL (ref 30.0–36.0)
MCV: 88.6 fl (ref 78.0–100.0)
Monocytes Absolute: 0.7 10*3/uL (ref 0.1–1.0)
Monocytes Relative: 11.3 % (ref 3.0–12.0)
Neutro Abs: 3.7 10*3/uL (ref 1.4–7.7)
Neutrophils Relative %: 60.4 % (ref 43.0–77.0)
Platelets: 178 10*3/uL (ref 150.0–400.0)
RBC: 4.78 Mil/uL (ref 4.22–5.81)
RDW: 13 % (ref 11.5–15.5)
WBC: 6.1 10*3/uL (ref 4.0–10.5)

## 2020-02-06 NOTE — Telephone Encounter (Signed)
Opened in error

## 2020-02-06 NOTE — Telephone Encounter (Signed)
Please contact pt and ask him if he has seen [facility] recently?  If not, I would like him to call their office to schedule a follow up visit.

## 2020-02-07 NOTE — Telephone Encounter (Signed)
Patient reports he has not seen [facility] recently, advised provider will like for him to call them and set up follow up visit. He verbalized understanding.

## 2020-02-18 ENCOUNTER — Ambulatory Visit: Payer: 59 | Admitting: Physician Assistant

## 2020-02-24 ENCOUNTER — Encounter: Payer: Self-pay | Admitting: Family

## 2020-02-25 ENCOUNTER — Encounter: Payer: Self-pay | Admitting: Family

## 2020-02-28 NOTE — Progress Notes (Signed)
Mailed out to pt 

## 2021-01-12 ENCOUNTER — Encounter: Payer: Self-pay | Admitting: Family

## 2021-01-12 ENCOUNTER — Other Ambulatory Visit: Payer: Self-pay

## 2021-01-12 ENCOUNTER — Ambulatory Visit (INDEPENDENT_AMBULATORY_CARE_PROVIDER_SITE_OTHER): Payer: 59 | Admitting: Family

## 2021-01-12 VITALS — BP 130/75 | HR 56 | Temp 97.8°F | Resp 16 | Ht 69.0 in | Wt 143.0 lb

## 2021-01-12 DIAGNOSIS — Z0000 Encounter for general adult medical examination without abnormal findings: Secondary | ICD-10-CM | POA: Diagnosis not present

## 2021-01-12 DIAGNOSIS — Z8719 Personal history of other diseases of the digestive system: Secondary | ICD-10-CM | POA: Diagnosis not present

## 2021-01-12 DIAGNOSIS — N028 Recurrent and persistent hematuria with other morphologic changes: Secondary | ICD-10-CM | POA: Diagnosis not present

## 2021-01-12 DIAGNOSIS — Z23 Encounter for immunization: Secondary | ICD-10-CM

## 2021-01-12 DIAGNOSIS — T148XXA Other injury of unspecified body region, initial encounter: Secondary | ICD-10-CM

## 2021-01-12 LAB — CBC WITH DIFFERENTIAL/PLATELET
Basophils Absolute: 0.1 10*3/uL (ref 0.0–0.1)
Basophils Relative: 0.7 % (ref 0.0–3.0)
Eosinophils Absolute: 0.4 10*3/uL (ref 0.0–0.7)
Eosinophils Relative: 5.6 % — ABNORMAL HIGH (ref 0.0–5.0)
HCT: 44.7 % (ref 39.0–52.0)
Hemoglobin: 15.4 g/dL (ref 13.0–17.0)
Lymphocytes Relative: 28 % (ref 12.0–46.0)
Lymphs Abs: 2.2 10*3/uL (ref 0.7–4.0)
MCHC: 34.5 g/dL (ref 30.0–36.0)
MCV: 89.7 fl (ref 78.0–100.0)
Monocytes Absolute: 0.8 10*3/uL (ref 0.1–1.0)
Monocytes Relative: 9.7 % (ref 3.0–12.0)
Neutro Abs: 4.4 10*3/uL (ref 1.4–7.7)
Neutrophils Relative %: 56 % (ref 43.0–77.0)
Platelets: 205 10*3/uL (ref 150.0–400.0)
RBC: 4.98 Mil/uL (ref 4.22–5.81)
RDW: 14.1 % (ref 11.5–15.5)
WBC: 7.8 10*3/uL (ref 4.0–10.5)

## 2021-01-12 LAB — COMPREHENSIVE METABOLIC PANEL
ALT: 22 U/L (ref 0–53)
AST: 28 U/L (ref 0–37)
Albumin: 4.3 g/dL (ref 3.5–5.2)
Alkaline Phosphatase: 55 U/L (ref 39–117)
BUN: 20 mg/dL (ref 6–23)
CO2: 30 mEq/L (ref 19–32)
Calcium: 9.3 mg/dL (ref 8.4–10.5)
Chloride: 104 mEq/L (ref 96–112)
Creatinine, Ser: 1.16 mg/dL (ref 0.40–1.50)
GFR: 88.28 mL/min (ref >60.00)
Glucose, Bld: 83 mg/dL (ref 70–99)
Potassium: 4.3 mEq/L (ref 3.5–5.1)
Sodium: 140 mEq/L (ref 135–145)
Total Bilirubin: 1 mg/dL (ref 0.2–1.2)
Total Protein: 7 g/dL (ref 6.0–8.3)

## 2021-01-12 NOTE — Patient Instructions (Addendum)
Please schedule a routine eye exam. Complete lab work prior to leaving. You should be contacted about scheduling your appointment with the kidney doctor and the foot doctor. Complete stool kit and return at your earliest convenience.

## 2021-01-12 NOTE — Progress Notes (Signed)
Subjective:    Patient ID: Seth Jimenez, male    DOB: 1996/06/19, 25 y.o.   MRN: [id number]  HPI  Patient presents today for complete physical.  Immunizations: Pfizer x 2.  No booster.  Tdap is due today.   Diet: healthy Exercise: goes to the GYM every day.  Dental: up to date Vision: due  He reports resolution of blood in the stool.  Last visit we placed a referral to gastroenterology, however it appears that he no showed this appointment.  Hx of IgA nephropathy- was on cellcept but he discontinued on his own.  He has previously followed with nephrology, however his last visit was back in 2019.  Review of Systems  Constitutional: Negative for unexpected weight change.  HENT: Negative for hearing loss and rhinorrhea.   Eyes: Negative for visual disturbance.  Respiratory: Negative for cough and shortness of breath.   Cardiovascular: Negative for chest pain and leg swelling.  Gastrointestinal: Negative for blood in stool, constipation and diarrhea.  Genitourinary: Negative for dysuria, frequency and hematuria.  Musculoskeletal: Negative for arthralgias and myalgias.  Skin: Negative for rash.  Neurological: Negative for headaches.  Hematological: Negative for adenopathy.  Psychiatric/Behavioral:       Denies depression/anxiety       Past Medical History:  Diagnosis Date  . Bronchitis, acute   . HTN (hypertension)   . Kidney disease    IgA nephropathy  . Nausea and vomiting      Social History   Socioeconomic History  . Marital status: Single    Spouse name: Not on file  . Number of children: Not on file  . Years of education: Not on file  . Highest education level: Not on file  Occupational History  . Not on file  Tobacco Use  . Smoking status: Never Smoker  . Smokeless tobacco: Never Used  Vaping Use  . Vaping Use: Never used  Substance and Sexual Activity  . Alcohol use: No  . Drug use: No  . Sexual activity: Never  Other Topics Concern  . 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     Lives with Mom, dad, 2 younger brothers and brother's girlfriend.   Enjoys drawing, video games   2 dogs       Social Determinants of Health   Financial Resource Strain: Not on file  Food Insecurity: Not on file  Transportation Needs: Not on file  Physical Activity: Not on file  Stress: Not on file  Social Connections: Not on file  Intimate Partner Violence: Not on file    Past Surgical History:  Procedure Laterality Date  . kidney biopsy  03/2013   IgA nephropathy with mesangial hyperscellularity, mild acute tubulo-interstital nephritis   . no past suregeries      Family History  Problem Relation Age of Onset  . Hypertension Father   . Diabetes Maternal Grandmother   . Diabetes Paternal Grandfather     No Known Allergies  No current outpatient medications on file prior to visit.   No current facility-administered medications on file prior to visit.    BP 130/75 (BP Location: Right Arm, Patient Position: Sitting, Cuff Size: Small)   Pulse (!) 56   Temp 97.8 F (36.6 C) (Oral)   Resp 16   Ht 5\' 9"  (1.753 m)   Wt 143 lb (64.9 kg)   SpO2 98%   BMI 21.12 kg/m  Objective:   Physical Exam  Physical Exam  Constitutional: He is oriented to person, place, and time. He appears well-developed and well-nourished. No distress.  HENT:  Head: Normocephalic and atraumatic.  Right Ear: Tympanic membrane and ear canal normal.  Left Ear: Tympanic membrane and ear canal normal.  Mouth/Throat: Not examined Eyes: Pupils are equal, round, and reactive to light. No scleral icterus.  Neck: Normal range of motion. No thyromegaly present.  Cardiovascular: Normal rate and regular rhythm.   No murmur heard. Pulmonary/Chest: Effort normal and breath sounds normal. No respiratory distress. He has no wheezes. He has no rales. He exhibits no tenderness.  Abdominal: Soft. Bowel sounds are  normal. He exhibits no distension and no mass. There is no tenderness. There is no rebound and no guarding.  Musculoskeletal: He exhibits no edema.  Lymphadenopathy:    He has no cervical adenopathy.  Neurological: He is alert and oriented to person, place, and time. He has normal patellar reflexes. He exhibits normal muscle tone. Coordination normal.  Skin: Skin is warm and dry. Callused, but fluid-filled blister left third toe medially Psychiatric: He has a normal mood and affect. His behavior is normal. Judgment and thought content normal.           Assessment & Plan:  Preventive care-I commended him on his healthy diet and regular exercise regimen.  Tdap today.  We also discussed importance of him getting a booster shot.  History of rectal bleeding-clinically resolved.  I will ask him to complete a fecal occult blood test.  We discussed that if the fecal occult blood test is still positive, then we will need to reschedule his consultation with gastroenterology.  History of IgA nephropathy-he has been lost to follow-up with nephrology.  I advised him on the importance of returning to their group and keeping up with regular appointments.  A referral has been placed back to [place].  We did discuss the importance of avoiding nephrotoxic agents such as NSAIDs and maintaining adequate hydration.  Blister-new. he has an enlarging blister left  third toes.  He would like to see podiatry for this.  Referral has been placed.  This visit occurred during the SARS-CoV-2 public health emergency.  Safety protocols were in place, including screening questions prior to the visit, additional usage of staff PPE, and extensive cleaning of exam room while observing appropriate contact time as indicated for disinfecting solutions.          Assessment & Plan:

## 2021-01-13 ENCOUNTER — Encounter: Payer: Self-pay | Admitting: Family

## 2021-01-13 NOTE — Progress Notes (Signed)
Mailed out to pt 

## 2021-01-15 NOTE — Addendum Note (Signed)
Addended by: Mervin Kung A on: 01/15/2021 03:50 PM   Modules accepted: Orders

## 2021-01-18 ENCOUNTER — Other Ambulatory Visit (INDEPENDENT_AMBULATORY_CARE_PROVIDER_SITE_OTHER): Payer: 59

## 2021-01-18 DIAGNOSIS — Z8719 Personal history of other diseases of the digestive system: Secondary | ICD-10-CM | POA: Diagnosis not present

## 2021-01-18 LAB — FECAL OCCULT BLOOD, IMMUNOCHEMICAL: Fecal Occult Bld: NEGATIVE

## 2021-01-19 ENCOUNTER — Encounter: Payer: Self-pay | Admitting: Family

## 2021-01-19 NOTE — Progress Notes (Signed)
Mailed out to pt 

## 2021-01-20 ENCOUNTER — Other Ambulatory Visit: Payer: Self-pay

## 2021-01-20 ENCOUNTER — Ambulatory Visit: Payer: 59 | Admitting: Podiatry

## 2021-01-20 DIAGNOSIS — S90426A Blister (nonthermal), unspecified lesser toe(s), initial encounter: Secondary | ICD-10-CM

## 2021-01-20 NOTE — Progress Notes (Signed)
   HPI: 25 y.o. male presenting today as a new patient for evaluation of a blister that developed to the patient's right fourth toe approximately 3-4 weeks ago.  Patient states that he recently started a new job at [employer] and wore a new pair of work boots that may have elicited the blister.  He has not done anything for treatment.  Past Medical History:  Diagnosis Date  . Bronchitis, acute   . HTN (hypertension)   . Kidney disease    IgA nephropathy  . Nausea and vomiting      Physical Exam: General: The patient is alert and oriented x3 in no acute distress.  Dermatology: Skin is warm, dry and supple bilateral lower extremities. Negative for open lesions or macerations.  Superficial hemorrhagic blister approximately 2 cm in length to the plantar medial aspect of the right fourth toe  Vascular: Palpable pedal pulses bilaterally. No edema or erythema noted. Capillary refill within normal limits.  Neurological: Epicritic and protective threshold grossly intact bilaterally.   Musculoskeletal Exam: Range of motion within normal limits to all pedal and ankle joints bilateral. Muscle strength 5/5 in all groups bilateral.   Assessment: 1.  Hemorrhagic blister fourth toe right foot   Plan of Care:  1. Patient evaluated.  2.  Light debridement of the hemorrhagic blister was performed and the blister was deroofed.  Healthy underlying skin.  No open wound noted. 3.  Recommend antibiotic ointment and Band-Aid x3 days 4.  Recommend the patient observes his work boots and possibly needs to get a different pair that are wide fitting.  He believes that he developed a blister when he got a new pair of work boots 5.  Return to clinic as needed  *Just started a job at [employer]      Felecia Shelling, DPM [facility]  Dr. Felecia Shelling, DPM    [address] [place], [place] [address]                Office [phone]  Fax [phone]

## 2021-03-15 ENCOUNTER — Encounter (HOSPITAL_COMMUNITY): Payer: Self-pay | Admitting: Emergency Medicine

## 2021-03-15 ENCOUNTER — Other Ambulatory Visit: Payer: Self-pay

## 2021-03-15 ENCOUNTER — Emergency Department (HOSPITAL_COMMUNITY)
Admission: EM | Admit: 2021-03-15 | Discharge: 2021-03-16 | Disposition: A | Discharge status: Home / Self Care | Payer: 59 | Attending: Emergency Medicine | Admitting: Emergency Medicine

## 2021-03-15 DIAGNOSIS — R040 Epistaxis: Secondary | ICD-10-CM | POA: Insufficient documentation

## 2021-03-15 DIAGNOSIS — I10 Essential (primary) hypertension: Secondary | ICD-10-CM | POA: Diagnosis not present

## 2021-03-15 NOTE — ED Provider Notes (Signed)
Emergency Medicine Provider Triage Evaluation Note  Andres Bantz , a 25 y.o. male  was evaluated in triage.  Pt complains of a nosebleed.  The patient reports that he developed a right-sided nosebleed around 18 hours ago.  He has had a history of recurrent nosebleeds, but typically they will stop on their own.  He was seen by another clinician earlier today and a Rhino Rocket was placed.  However, about 8 hours after the Rhino Rocket was placed he started noticing blood dripping again.  He has intermittently felt some dripping down the back of his throat, but denies any at this time.  No dizziness or lightheadedness or shortness of breath.  No recent trauma or injury.  He does not take any blood thinners.  No other treatment prior to arrival.  He denies passing large clots or significant blood loss.  Review of Systems  Positive: Epistaxis Negative: Lightheadedness or shortness of breath  Physical Exam  BP (!) 129/92 (BP Location: Right Arm)   Pulse (!) 56   Temp 97.7 F (36.5 C) (Oral)   Resp 17   SpO2 100%  Gen:   Awake, no distress   Resp:  Normal effort  MSK:   Moves extremities without difficulty  Other:  Rhino Rocket in place to the right nare.  No active oozing of blood around the Rhino Rocket at this time.  Posterior oropharynx is unremarkable.  Medical Decision Making  Medically screening exam initiated at 11:08 PM.  Appropriate orders placed.  Ohn Bostic was informed that the remainder of the evaluation will be completed by another provider, this initial triage assessment does not replace that evaluation, and the importance of remaining in the ED until their evaluation is complete.  24 year old male presenting with epistaxis that persisted after having a Rhino Rocket placed in the right nare earlier today.  He will require further work-up in the emergency department for epistaxis.  His care has been initiated today.   Frederik Pear A, PA-C 03/15/21 2311    Shon Baton, MD 03/16/21 [phone]

## 2021-03-15 NOTE — ED Triage Notes (Signed)
Patient reports persistent epistaxis at right nose after application of nasal packing of his MD today , denies nasal injury/no anticoagulant medication .

## 2021-03-16 NOTE — Discharge Instructions (Addendum)
1. Medications: usual home medications 2. Treatment: rest, drink plenty of fluids,  3. Follow Up: Please followup with ENT in 1-2 days.  Please return to the ER for persistent bleeding, lightheadedness or other concerns

## 2021-03-16 NOTE — ED Provider Notes (Signed)
[facility] EMERGENCY DEPARTMENT Provider Note   CSN: [id number] Arrival date & time: 03/15/21  1950     History Chief Complaint  Patient presents with  . Epistaxis    Seth Jimenez is a 25 y.o. male presents to the Emergency Department complaining of intermittent nosebleeds.  Patient reports that he has been getting them since he was a child.  He started a new job and they are concerned, requesting that he be evaluated.  Patient had a nosebleed yesterday morning at 4 AM and was seen around noon at an urgent care in [place].  He reports a [employer] was placed.  He went home and slept.  Approximately 8 hours later between 7 and 8 PM he noticed some intermittent dripping down the back of his throat and occasionally from the anterior nare.  He reports this was self-limited and he has had no additional bleeding since that time.  Patient denies dizziness or lightheadedness, chest pain or shortness of breath.  Denies recent trauma or injury.  He does not take blood thinners or aspirin.  No treatments prior to arrival aside from the [employer].  The history is provided by the patient and medical records. No language interpreter was used.       Past Medical History:  Diagnosis Date  . Bronchitis, acute   . HTN (hypertension)   . Kidney disease    IgA nephropathy  . Nausea and vomiting     Patient Active Problem List   Diagnosis Date Noted  . Non-intractable vomiting 02/15/2019  . Preventative health care 04/16/2015  . Nail problem 03/10/2015  . 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 HTN (hypertension) 03/10/2015  . Acne 09/05/2012    Past Surgical History:  Procedure Laterality Date  . kidney biopsy  03/2013   IgA nephropathy with mesangial hyperscellularity, mild acute tubulo-interstital nephritis   . no past suregeries         Family History  Problem Relation Age of Onset  . Hypertension Father   . Diabetes Maternal Grandmother   . Diabetes Paternal  Grandfather     Social History   Tobacco Use  . Smoking status: Never Smoker  . Smokeless tobacco: Never Used  Vaping Use  . Vaping Use: Never used  Substance Use Topics  . Alcohol use: No  . Drug use: No    Home Medications Prior to Admission medications   Not on File    Allergies    Patient has no known allergies.  Review of Systems   Review of Systems  Constitutional: Negative for chills and fever.  HENT: Positive for nosebleeds.   Respiratory: Negative for shortness of breath.   Neurological: Negative for weakness, light-headedness and headaches.    Physical Exam Updated Vital Signs BP (!) 144/83   Pulse 63   Temp 97.8 F (36.6 C) (Oral)   Resp (!) 21   Ht 5\' 8"  (1.727 m)   Wt 62 kg   SpO2 99%   BMI 20.78 kg/m   Physical Exam Vitals and nursing note reviewed.  Constitutional:      General: He is not in acute distress.    Appearance: He is well-developed.  HENT:     Head: Normocephalic.     Nose:     Comments: Rhino rocket in right nare - no persistent bleeding Eyes:     General: No scleral icterus.    Conjunctiva/sclera: Conjunctivae normal.  Cardiovascular:     Rate and Rhythm: Normal rate.  Pulmonary:     Effort: Pulmonary effort is normal.  Musculoskeletal:        General: Normal range of motion.     Cervical back: Normal range of motion.  Skin:    General: Skin is warm and dry.  Neurological:     Mental Status: He is alert.     ED Results / Procedures / Treatments    Procedures Procedures   Medications Ordered in ED Medications - No data to display  ED Course  I have reviewed the triage vital signs and the nursing notes.  Pertinent labs & imaging results that were available during my care of the patient were reviewed by me and considered in my medical decision making (see chart for details).    MDM Rules/Calculators/A&P                           Patient presents with complaints of persistent bleeding after Rhino Rocket  placement at 12:00 yesterday.  Last bleed was minimal and was 11 hours ago.  No additional bleeding for the last 11 hours.  Rhino Rocket appears to be correctly placed.  Patient well-appearing with reassuring vital signs.  We will have him follow with ENT.  Discussed reasons to return to the emergency department.  Patient states understanding and is in agreement with the plan.   Final Clinical Impression(s) / ED Diagnoses Final diagnoses:  Epistaxis    Rx / DC Orders ED Discharge Orders    None       Khadeejah Castner, Boyd Kerbs 03/16/21 0601    Melene Plan, DO 03/16/21 [phone]

## 2021-03-19 ENCOUNTER — Ambulatory Visit: Payer: 59 | Admitting: Family

## 2021-03-19 NOTE — Progress Notes (Incomplete)
Subjective:   By signing my name below, I, Shehryar Baig, attest that this documentation has been prepared under the direction and in the presence of Sandford Craze NP. 03/19/2021    Patient ID: Seth Jimenez Comment, male    DOB: 12-09-1995, 25 y.o.   MRN: [id number]  No chief complaint on file.   HPI Patient is in today for a follow up from the ER.    Health Maintenance Due  Topic Date Due   HPV VACCINES (1 - Male 2-dose series) Never done   HIV Screening  Never done   Hepatitis C Screening  Never done   COVID-19 Vaccine (3 - Booster for Pfizer series) 07/19/2020    Past Medical History:  Diagnosis Date   Bronchitis, acute    HTN (hypertension)    Kidney disease    IgA nephropathy   Nausea and vomiting     Past Surgical History:  Procedure Laterality Date   kidney biopsy  03/2013   IgA nephropathy with mesangial hyperscellularity, mild acute tubulo-interstital nephritis    no past suregeries      Family History  Problem Relation Age of Onset   Hypertension Father    Diabetes Maternal Grandmother    Diabetes Paternal Grandfather     Social History   Socioeconomic History   Marital status: Single    Spouse name: Not on file   Number of children: Not on file   Years of education: Not on file   Highest education level: Not on file  Occupational History   Not on file  Tobacco Use   Smoking status: Never   Smokeless tobacco: Never  Vaping Use   Vaping Use: Never used  Substance and Sexual Activity   Alcohol use: No   Drug use: No   Sexual activity: Never  Other Topics Concern   Not on file  Social History Narrative   Was at [employer] [facility] [employer]- dropped out   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     Lives with Mom, dad, 2 younger brothers and brother's girlfriend.   Enjoys drawing, video games   2 dogs       Social Determinants of Health   Financial Resource Strain: Not on file  Food Insecurity: Not on file  Transportation Needs: Not on file  Physical Activity: Not  on file  Stress: Not on file  Social Connections: Not on file  Intimate Partner Violence: Not on file    No outpatient medications prior to visit.   No facility-administered medications prior to visit.    No Known Allergies  ROS     Objective:    Physical Exam Constitutional:      General: He is not in acute distress.    Appearance: Normal appearance. He is not ill-appearing.  HENT:     Head: Normocephalic and atraumatic.     Right Ear: External ear normal.     Left Ear: External ear normal.  Eyes:     Extraocular Movements: Extraocular movements intact.     Pupils: Pupils are equal, round, and reactive to light.  Cardiovascular:     Rate and Rhythm: Normal rate and regular rhythm.     Pulses: Normal pulses.     Heart sounds: Normal heart sounds. No murmur heard.   No gallop.  Pulmonary:     Effort: Pulmonary effort is normal. No respiratory distress.     Breath sounds: Normal breath sounds. No wheezing, rhonchi or rales.  Skin:    General: Skin is  warm and dry.  Neurological:     Mental Status: He is alert and oriented to person, place, and time.  Psychiatric:        Behavior: Behavior normal.    There were no vitals taken for this visit. Wt Readings from Last 3 Encounters:  03/15/21 136 lb 11 oz (62 kg)  01/12/21 143 lb (64.9 kg)  02/04/20 131 lb (59.4 kg)       Assessment & Plan:   Problem List Items Addressed This Visit   None    No orders of the defined types were placed in this encounter.   I, Sandford Craze NP, personally preformed the services described in this documentation.  All medical record entries made by the scribe were at my direction and in my presence.  I have reviewed the chart and discharge instructions (if applicable) and agree that the record reflects my personal performance and is accurate and complete. 03/19/2021   I,Shehryar Baig,acting as a Neurosurgeon for Lemont Fillers, NP.,have documented all relevant documentation on  the behalf of Lemont Fillers, NP,as directed by  Lemont Fillers, NP while in the presence of Lemont Fillers, NP.   Shehryar [employer]

## 2021-05-30 IMAGING — US ULTRASOUND ABDOMEN COMPLETE
1 series · 14 of 25 positions shown · non-contrast
Comparison: CT 02/25/2015

CLINICAL DATA: Nausea vomiting abdominal pain

EXAM:
ABDOMEN ULTRASOUND COMPLETE

[Series 1: ultrasound abdomen complete · 14 of 121 slices shown]
[im 1/121]
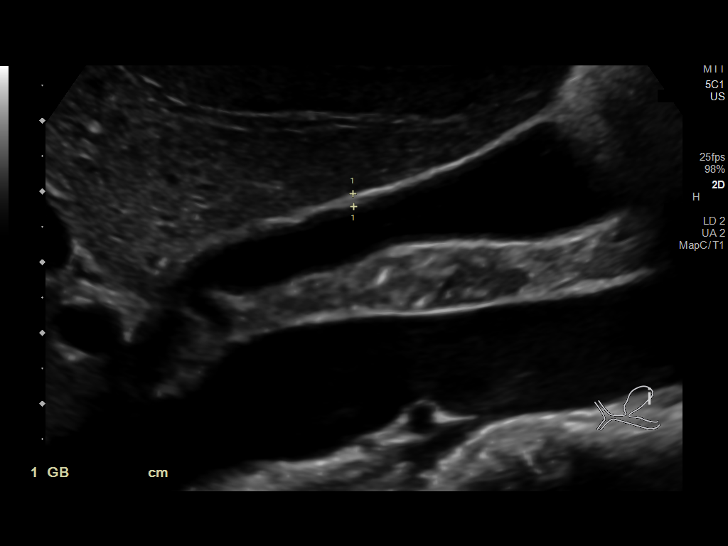
[im 11/121]
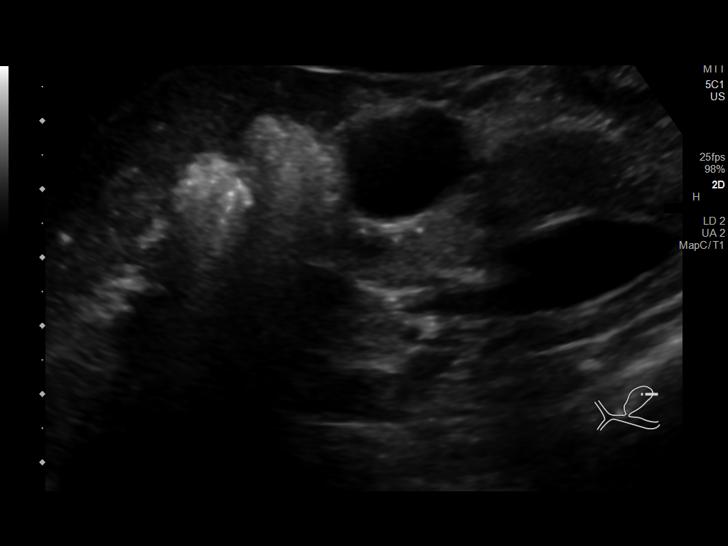
[im 21/121]
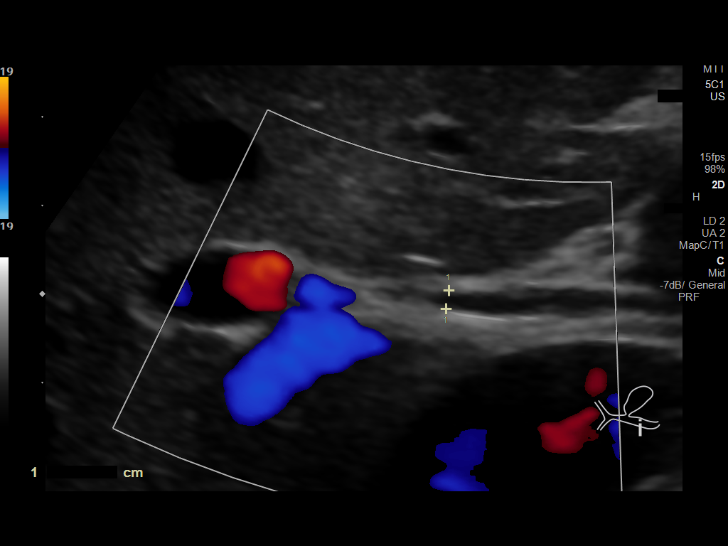
[im 31/121]
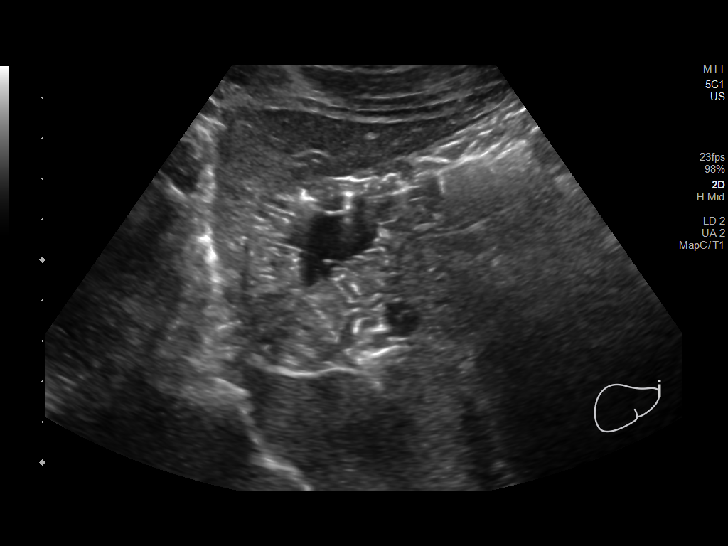
[im 41/121]
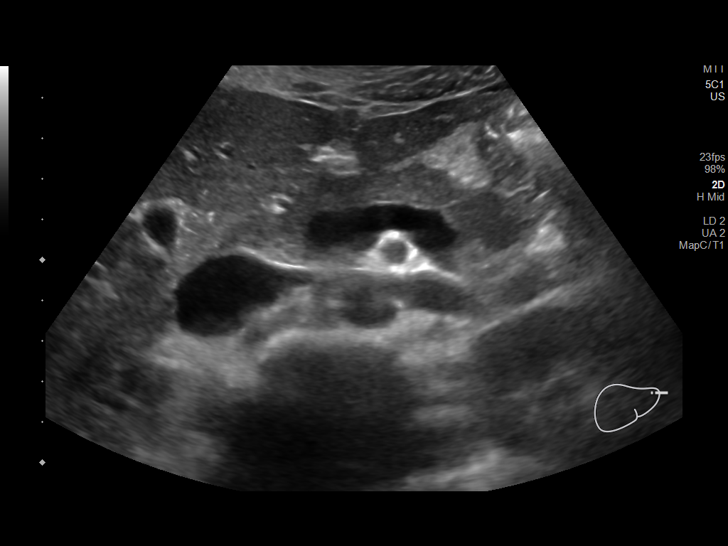
[im 46/121]
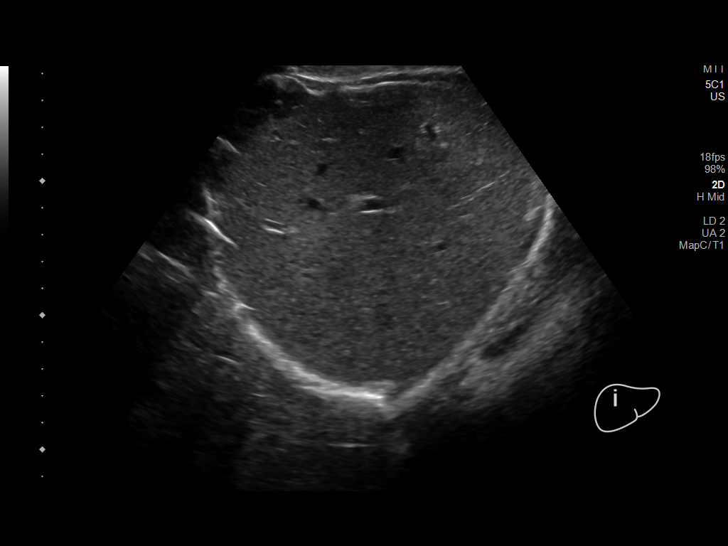
[im 56/121]
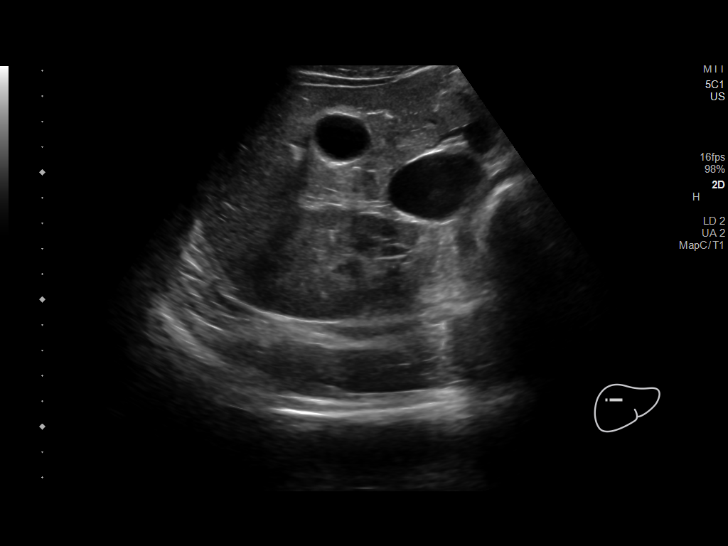
[im 66/121]
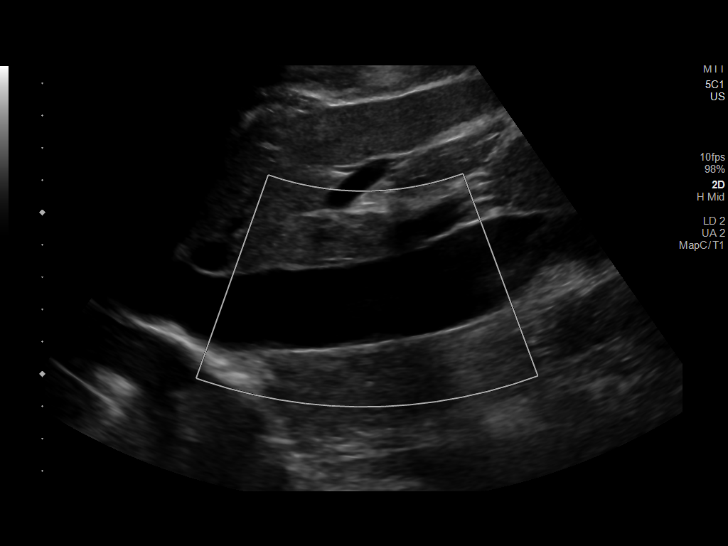
[im 76/121]
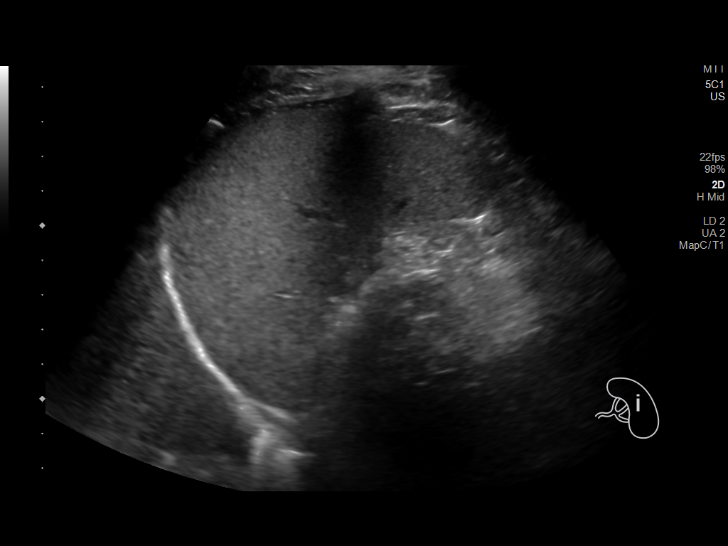
[im 81/121]
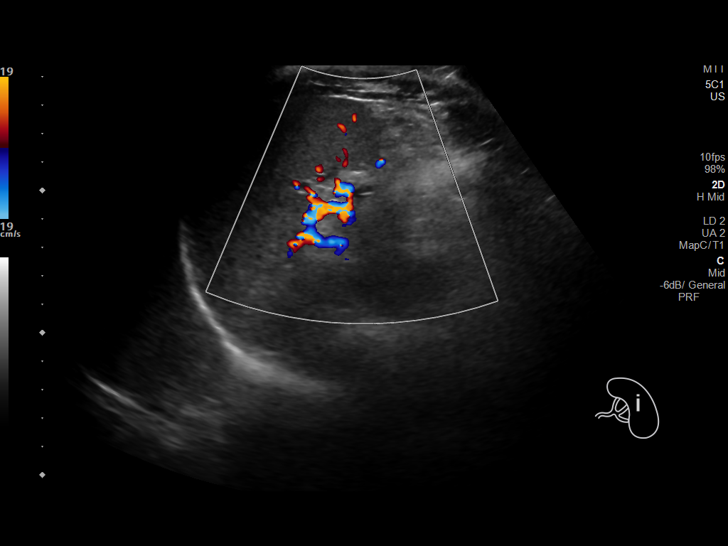
[im 91/121]
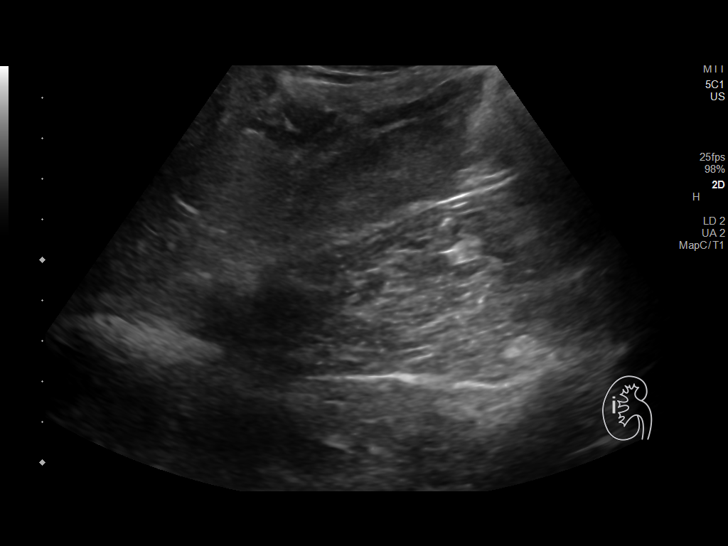
[im 101/121]
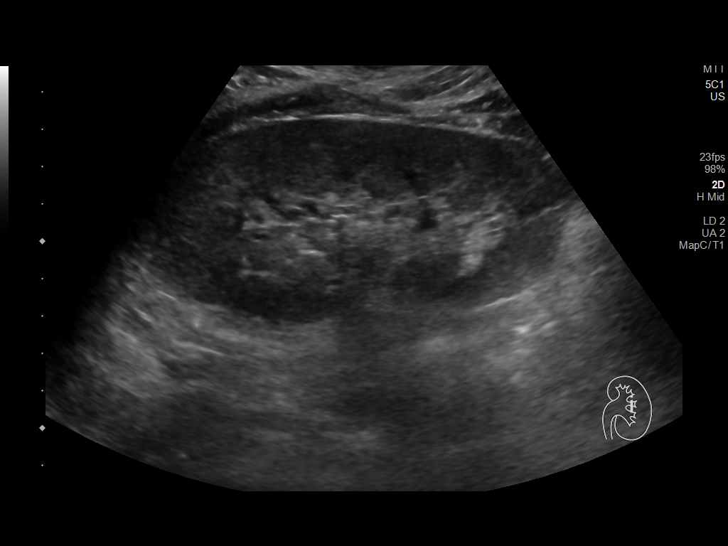
[im 111/121]
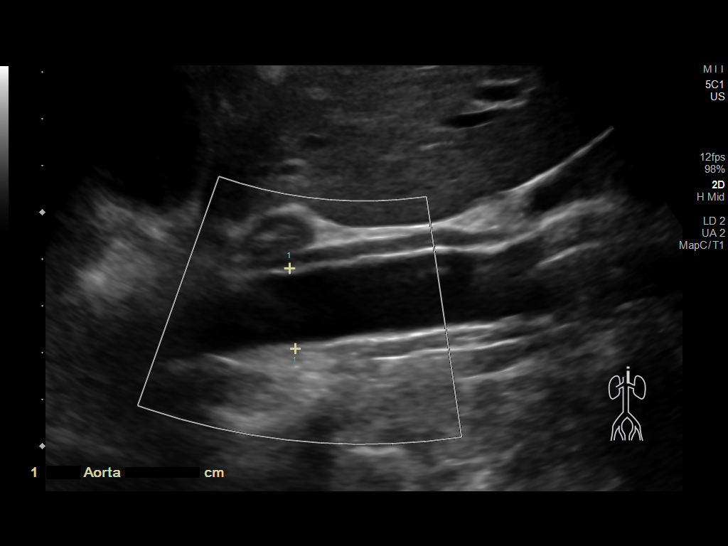
[im 121/121]
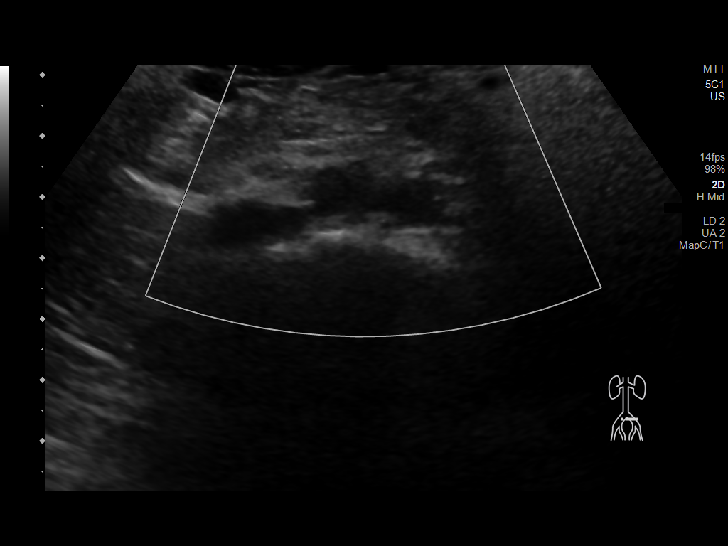

[14 of 25 positions shown; findings below may reference images not displayed]

FINDINGS: Gallbladder: No gallstones or wall thickening visualized. No
sonographic Murphy sign noted by sonographer.

Common bile duct: Diameter: 2.1 mm

Liver: No focal lesion identified. Within normal limits in
parenchymal echogenicity. Portal vein is patent on color Doppler
imaging with normal direction of blood flow towards the liver.

IVC: No abnormality visualized.

Pancreas: Visualized portion unremarkable.

Spleen: Size and appearance within normal limits.

Right Kidney: Length: 10.7 cm. Slight increased cortical
echogenicity. No hydronephrosis or mass.

Left Kidney: Length: 10.9 cm. Slight increased cortical
echogenicity. No hydronephrosis or mass

Abdominal aorta: No aneurysm visualized.

Other findings: None.
IMPRESSION: 1. Negative for gallstones or biliary dilatation.
2. Slightly echogenic kidneys which may reflect medical renal
disease.

## 2021-07-16 ENCOUNTER — Other Ambulatory Visit: Payer: Self-pay

## 2021-07-16 ENCOUNTER — Ambulatory Visit: Payer: 59 | Admitting: Family

## 2021-07-16 ENCOUNTER — Telehealth: Payer: Self-pay | Admitting: Family

## 2021-07-16 VITALS — BP 129/77 | HR 71 | Temp 98.7°F | Resp 16 | Ht 69.0 in | Wt 140.0 lb

## 2021-07-16 DIAGNOSIS — Z23 Encounter for immunization: Secondary | ICD-10-CM

## 2021-07-16 DIAGNOSIS — K921 Melena: Secondary | ICD-10-CM

## 2021-07-16 DIAGNOSIS — I10 Essential (primary) hypertension: Secondary | ICD-10-CM

## 2021-07-16 DIAGNOSIS — N028 Recurrent and persistent hematuria with other morphologic changes: Secondary | ICD-10-CM | POA: Diagnosis not present

## 2021-07-16 LAB — BASIC METABOLIC PANEL
BUN: 21 mg/dL (ref 6–23)
CO2: 29 mEq/L (ref 19–32)
Calcium: 9.3 mg/dL (ref 8.4–10.5)
Chloride: 106 mEq/L (ref 96–112)
Creatinine, Ser: 1.23 mg/dL (ref 0.40–1.50)
GFR: 81.99 mL/min (ref >60.00)
Glucose, Bld: 120 mg/dL — ABNORMAL HIGH (ref 70–99)
Potassium: 4 mEq/L (ref 3.5–5.1)
Sodium: 141 mEq/L (ref 135–145)

## 2021-07-16 NOTE — Progress Notes (Signed)
Subjective:   By signing my name below, I, Seth Jimenez, attest that this documentation has been prepared under the direction and in the presence of Sandford Craze, NP, 07/16/2021   Patient ID: Seth Jimenez, male    DOB: May 05, 1996, 25 y.o.   MRN: [id number]  Chief Complaint  Patient presents with   Follow-up    Here for 6 months follow up, "doing well"    HPI Patient is in today for an office visit.   IGA nephropathy: He was diagnosed with IGA nephropathy. Since his last visit he had an appointment scheduled with a kidney specialist but did not make it to his appointment. He has not yet rescheduled this appointment.  Blood in stool: Since his last visit he has not seen any blood in stool and the sample provided to the office did not have any traces of blood. Blister on foot: He had a blister on his foot at the time of his last visit but has since seen the podiatrist and resolved this concern.  Immunizations: He believes he received his flu shot with his work for the season. He is interested in receiving the HPV vaccine and the updated Covid-19 booster at this time.   Health Maintenance Due  Topic Date Due   HPV VACCINES (1 - Male 2-dose series) Never done   HIV Screening  Never done   Hepatitis C Screening  Never done   COVID-19 Vaccine (3 - Booster for Pfizer series) 07/19/2020    Past Medical History:  Diagnosis Date   Bronchitis, acute    HTN (hypertension)    Kidney disease    IgA nephropathy   Nausea and vomiting     Past Surgical History:  Procedure Laterality Date   kidney biopsy  03/2013   IgA nephropathy with mesangial hyperscellularity, mild acute tubulo-interstital nephritis    no past suregeries      Family History  Problem Relation Age of Onset   Hypertension Father    Diabetes Maternal Grandmother    Diabetes Paternal Grandfather     Social History   Socioeconomic History   Marital status: Single    Spouse name: Not on file    Number of children: Not on file   Years of education: Not on file   Highest education level: Not on file  Occupational History   Not on file  Tobacco Use   Smoking status: Never   Smokeless tobacco: Never  Vaping Use   Vaping Use: Never used  Substance and Sexual Activity   Alcohol use: No   Drug use: No   Sexual activity: Never  Other Topics Concern   Not on file  Social History Narrative   Was at [employer]- dropped out   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     Lives with Mom, dad, 2 younger brothers and brother's girlfriend.   Enjoys drawing, video games   2 dogs       Social Determinants of Health   Financial Resource Strain: Not on file  Food Insecurity: Not on file  Transportation Needs: Not on file  Physical Activity: Not on file  Stress: Not on file  Social Connections: Not on file  Intimate Partner Violence: Not on file    No outpatient medications prior to visit.   No facility-administered medications prior to visit.    No Known Allergies  ROS    See HPI Objective:    Physical Exam Constitutional:      General: He is not  in acute distress.    Appearance: Normal appearance. He is not ill-appearing.  HENT:     Head: Normocephalic and atraumatic.     Right Ear: External ear normal.     Left Ear: External ear normal.  Eyes:     Extraocular Movements: Extraocular movements intact.     Pupils: Pupils are equal, round, and reactive to light.  Cardiovascular:     Rate and Rhythm: Normal rate and regular rhythm.     Heart sounds: Normal heart sounds. No murmur heard.   No gallop.  Pulmonary:     Effort: Pulmonary effort is normal. No respiratory distress.     Breath sounds: Normal breath sounds. No wheezing or rales.  Skin:    General: Skin is warm and dry.  Neurological:     Mental Status: He is alert and oriented to person, place, and time.  Psychiatric:        Behavior: Behavior normal.        Judgment: Judgment normal.    BP 129/77 (BP Location: Right  Arm, Patient Position: Sitting, Cuff Size: Small)   Pulse 71   Temp 98.7 F (37.1 C) (Oral)   Resp 16   Ht 5\' 9"  (1.753 m)   Wt 140 lb (63.5 kg)   SpO2 98%   BMI 20.67 kg/m  Wt Readings from Last 3 Encounters:  07/16/21 140 lb (63.5 kg)  03/15/21 136 lb 11 oz (62 kg)  01/12/21 143 lb (64.9 kg)       Assessment & Plan:   Problem List Items Addressed This Visit       Unprioritized   IgA nephropathy - Primary    Unfortunately, he reports that he no-showed his appointment with nephrology and has not rescheduled. We discussed importance of routine follow up with nephrology so they can monitor his renal function and treat him to hopefully prevent progress of kidney disease and possible dialysis down the road.  Pt verbalized understanding.       Relevant Orders   Basic metabolic panel   HTN (hypertension)    BP Readings from Last 3 Encounters:  07/16/21 129/77  03/16/21 126/80  01/12/21 130/75  BP is stable today. He is not on an anti-hypertensive. Continue to monitor.       Blood in stool    Denies any further BRBPR since last visit. In addition he had a negative IFOB following last visit. Monitor.        No orders of the defined types were placed in this encounter.  Gardisil #1 of 3 today.   I, 03/14/21, NP, personally preformed the services described in this documentation.  All medical record entries made by the scribe were at my direction and in my presence.  I have reviewed the chart and discharge instructions (if applicable) and agree that the record reflects my personal performance and is accurate and complete. 07/16/2021  I,Seth Jimenez,acting as a 09/15/2021 for Neurosurgeon, NP.,have documented all relevant documentation on the behalf of Lemont Fillers, NP,as directed by  Lemont Fillers, NP while in the presence of Lemont Fillers, NP.  Lemont Fillers, NP

## 2021-07-16 NOTE — Telephone Encounter (Signed)
Kidney function looks good. Sugar was a little high. I would recommend A1C dx hyperglycemia when he can return to lab.

## 2021-07-16 NOTE — Patient Instructions (Signed)
Please schedule a follow up appointment with the kidney doctor at your earliest convenience.

## 2021-07-16 NOTE — Assessment & Plan Note (Signed)
Unfortunately, he reports that he no-showed his appointment with nephrology and has not rescheduled. We discussed importance of routine follow up with nephrology so they can monitor his renal function and treat him to hopefully prevent progress of kidney disease and possible dialysis down the road.  Pt verbalized understanding.

## 2021-07-16 NOTE — Assessment & Plan Note (Signed)
Denies any further BRBPR since last visit. In addition he had a negative IFOB following last visit. Monitor.

## 2021-07-16 NOTE — Assessment & Plan Note (Signed)
BP Readings from Last 3 Encounters:  07/16/21 129/77  03/16/21 126/80  01/12/21 130/75   BP is stable today. He is not on an anti-hypertensive. Continue to monitor.

## 2021-07-19 ENCOUNTER — Other Ambulatory Visit: Payer: Self-pay

## 2021-07-19 DIAGNOSIS — R739 Hyperglycemia, unspecified: Secondary | ICD-10-CM

## 2021-07-19 NOTE — Telephone Encounter (Signed)
Called but no answer, lvm for patient to call back for results  

## 2021-07-21 NOTE — Telephone Encounter (Signed)
Talked to patient and advised of results. He was scheduled to com in tomorrow am

## 2021-07-22 ENCOUNTER — Other Ambulatory Visit: Payer: 59

## 2021-09-15 ENCOUNTER — Ambulatory Visit: Payer: 59

## 2022-01-18 ENCOUNTER — Encounter: Payer: 59 | Admitting: Family

## 2022-01-25 ENCOUNTER — Ambulatory Visit (INDEPENDENT_AMBULATORY_CARE_PROVIDER_SITE_OTHER): Payer: 59 | Admitting: Family

## 2022-01-25 ENCOUNTER — Encounter: Payer: Self-pay | Admitting: Family

## 2022-01-25 ENCOUNTER — Ambulatory Visit: Payer: 59 | Attending: Internal Medicine

## 2022-01-25 VITALS — BP 122/75 | HR 66 | Temp 98.4°F | Resp 16 | Ht 69.0 in | Wt 141.2 lb

## 2022-01-25 DIAGNOSIS — Z114 Encounter for screening for human immunodeficiency virus [HIV]: Secondary | ICD-10-CM

## 2022-01-25 DIAGNOSIS — Z1159 Encounter for screening for other viral diseases: Secondary | ICD-10-CM | POA: Diagnosis not present

## 2022-01-25 DIAGNOSIS — Z23 Encounter for immunization: Secondary | ICD-10-CM | POA: Diagnosis not present

## 2022-01-25 DIAGNOSIS — Z0000 Encounter for general adult medical examination without abnormal findings: Secondary | ICD-10-CM | POA: Diagnosis not present

## 2022-01-25 DIAGNOSIS — N028 Recurrent and persistent hematuria with other morphologic changes: Secondary | ICD-10-CM

## 2022-01-25 DIAGNOSIS — Z136 Encounter for screening for cardiovascular disorders: Secondary | ICD-10-CM

## 2022-01-25 NOTE — Patient Instructions (Addendum)
Please schedule routine eye exam. ?Add 30 minutes 5 days a week of exercise.  ?Please follow through with appointment with the Kidney doctor when they call you.  ?

## 2022-01-25 NOTE — Progress Notes (Addendum)
? ?Subjective:  ? ?By signing my name below, I, Seth Jimenez, attest that this documentation has been prepared under the direction and in the presence of Seth Jimenez, 01/25/2022 ? ? Patient ID: Seth Jimenez, male    DOB: [birth year], 26 y.o.   MRN: [id number] ? ?Chief Complaint  ?Patient presents with  ? Annual Exam  ? ? ?HPI ?Patient is in today for a comprehensive physical exam. ? ?Kidneys - He has not followed up with a kidney specialist. ? ?He denies having any fever, ear pain, new muscle pain, joint pain, new moles, congestion, sinus pain, sore throat, palpations, wheezing, n/v/d, constipation, blood in stool, dysuria, frequency, hematuria, headaches, depresssion or anxiety at this time. ? ?Social History: He has had no recent surgeries. He denies of changes to his family history.  ?Immunizations: He is interested in receiving the COVID - 19 bivalent vaccine, the Gardasil vaccine and a screening for HIV/HepC.  ?Diet: He eats a balanced breakfast and protein - rich meal for dinner and occasionally lunch. He does not eat many vegetables. He drinks sweet tea. ?Exercise: He is active at work but doesn't have a regular exercise routine.  ?Dental: He is UTD on dental exams.  ?Vision: He is not UTD on vision exams.  ? ? ?Health Maintenance Due  ?Topic Date Due  ? Hepatitis C Screening  Never done  ? COVID-19 Vaccine (3 - Booster for Pfizer series) 04/13/2020  ? HPV VACCINES (3 - Male 3-dose series) 05/27/2022  ? ? ?Past Medical History:  ?Diagnosis Date  ? Bronchitis, acute   ? HTN (hypertension)   ? Kidney disease   ? IgA nephropathy  ? Nausea and vomiting   ? ? ?Past Surgical History:  ?Procedure Laterality Date  ? kidney biopsy  03/2013  ? IgA nephropathy with mesangial hyperscellularity, mild acute tubulo-interstital nephritis   ? no past suregeries    ? ? ?Family History  ?Problem Relation Age of Onset  ? Hypertension Father   ? Diabetes Maternal Grandmother   ? Diabetes Paternal Grandfather    ? ? ?Social History  ? ?Socioeconomic History  ? Marital status: Single  ?  Spouse name: Not on file  ? Number of children: Not on file  ? Years of education: Not on file  ? Highest education level: Not on file  ?Occupational History  ? Not on file  ?Tobacco Use  ? Smoking status: Never  ? Smokeless tobacco: Never  ?Vaping Use  ? Vaping Use: Never used  ?Substance and Sexual Activity  ? Alcohol use: No  ? Drug use: No  ? Sexual activity: Never  ?Other Topics Concern  ? Not on file  ?Social History Narrative  ? Was at [employer]- dropped out  ? Works at a [place].    ? Lives with Mom, dad, 2 younger brothers and brother's girlfriend.  ? Enjoys drawing, video games  ? 2 dogs   ?   ? ?Social Determinants of Health  ? ?Financial Resource Strain: Not on file  ?Food Insecurity: Not on file  ?Transportation Needs: Not on file  ?Physical Activity: Not on file  ?Stress: Not on file  ?Social Connections: Not on file  ?Intimate Partner Violence: Not on file  ? ? ?No outpatient medications prior to visit.  ? ?No facility-administered medications prior to visit.  ? ? ?No Known Allergies ? ?Review of Systems  ?Constitutional:  Negative for fever.  ?HENT:  Negative for congestion, ear pain, sinus pain and  sore throat.   ?Respiratory:  Negative for wheezing.   ?Cardiovascular:  Negative for palpitations.  ?Gastrointestinal:  Negative for blood in stool, constipation, diarrhea, nausea and vomiting.  ?Genitourinary:  Negative for dysuria, frequency and hematuria.  ?Skin:   ?     (-) [place]  ?Neurological:  Negative for headaches.  ?Psychiatric/Behavioral:  Negative for depression. The patient is not nervous/anxious.   ? ?   ?Objective:  ?  ?Physical Exam ?Constitutional:   ?   General: He is not in acute distress. ?   Appearance: Normal appearance. He is not ill-appearing.  ?HENT:  ?   Head: Normocephalic and atraumatic.  ?   Right Ear: Tympanic membrane, ear canal and external ear normal. There is impacted cerumen.  ?   Left  Ear: Tympanic membrane, ear canal and external ear normal. There is impacted cerumen.  ?Eyes:  ?   Extraocular Movements: Extraocular movements intact.  ?   Pupils: Pupils are equal, round, and reactive to light.  ?Cardiovascular:  ?   Rate and Rhythm: Normal rate and regular rhythm.  ?   Heart sounds: Normal heart sounds. No murmur heard. ?  No gallop.  ?Pulmonary:  ?   Effort: Pulmonary effort is normal. No respiratory distress.  ?   Breath sounds: Normal breath sounds. No wheezing or rales.  ?Abdominal:  ?   General: Bowel sounds are normal. There is no distension.  ?   Palpations: Abdomen is soft.  ?   Tenderness: There is no abdominal tenderness. There is no guarding.  ?Musculoskeletal:  ?   Comments: 5/5 strength in both upper and lower extremities   ?Skin: ?   General: Skin is warm and dry.  ?Neurological:  ?   Mental Status: He is alert and oriented to person, place, and time.  ?   Deep Tendon Reflexes:  ?   Reflex Scores: ?     Patellar reflexes are 2+ on the right side and 2+ on the left side. ?Psychiatric:     ?   Mood and Affect: Mood normal.     ?   Behavior: Behavior normal.     ?   Judgment: Judgment normal.  ? ? ?BP 122/75 (BP Location: Right Arm, Patient Position: Sitting, Cuff Size: Small)   Pulse 66   Temp 98.4 ?F (36.9 ?C) (Oral)   Resp 16   Ht _0  (1.753 m)   Wt 141 lb 3.2 oz (64 kg)   SpO2 100%   BMI 20.85 kg/m?  ?Wt Readings from Last 3 Encounters:  ?01/25/22 141 lb 3.2 oz (64 kg)  ?07/16/21 140 lb (63.5 kg)  ?03/15/21 136 lb 11 oz (62 kg)  ? ? ?   ?Assessment & Plan:  ? ?Problem List Items Addressed This Visit   ? ?  ? Unprioritized  ? Preventative health care - Primary  ?  Discussed adding 30 minutes of cardio 5 days a week. Gardisil #1 today. He plans to get the bivalent covid booster at the pharmacy.  ? ?  ?  ? Relevant Orders  ? Lipid panel  ? IgA nephropathy  ? Relevant Orders  ? Ambulatory referral to Nephrology  ? Comp Met (CMET)  ? CBC with Differential/Platelet  ? ?Other  Visit Diagnoses   ? ? Need for hepatitis C screening test      ? Relevant Orders  ? Hepatitis C Antibody  ? Encounter for screening for HIV      ? Relevant  Orders  ? HIV antibody (with reflex)  ? Need for HPV vaccination      ? Relevant Orders  ? HPV 9-valent vaccine,Recombinat (Completed)  ? ?  ? ? ? ? ?No orders of the defined types were placed in this encounter. ? ? ?I, Nance Pear, Jimenez, personally preformed the services described in this documentation.  All medical record entries made by the scribe were at my direction and in my presence.  I have reviewed the chart and discharge instructions (if applicable) and agree that the record reflects my personal performance and is accurate and complete. 01/25/2022 ? ? ?I,Amber Collins,acting as a Education administrator for [employer], Jimenez.,have documented all relevant documentation on the behalf of Nance Pear, Jimenez,as directed by  Nance Pear, Jimenez while in the presence of Nance Pear, Jimenez. ? ? ? ?Nance Pear, Jimenez ? ?

## 2022-01-25 NOTE — Assessment & Plan Note (Addendum)
Discussed adding 30 minutes of cardio 5 days a week. Gardisil #1 today. He plans to get the bivalent covid booster at the pharmacy.  ?

## 2022-01-26 ENCOUNTER — Encounter: Payer: Self-pay | Admitting: Family

## 2022-01-26 LAB — CBC WITH DIFFERENTIAL/PLATELET
Basophils Absolute: 0 10*3/uL (ref 0.0–0.1)
Basophils Relative: 0.6 % (ref 0.0–3.0)
Eosinophils Absolute: 0.2 10*3/uL (ref 0.0–0.7)
Eosinophils Relative: 2.8 % (ref 0.0–5.0)
HCT: 47.3 % (ref 39.0–52.0)
Hemoglobin: 16.1 g/dL (ref 13.0–17.0)
Lymphocytes Relative: 26.6 % (ref 12.0–46.0)
Lymphs Abs: 2.1 10*3/uL (ref 0.7–4.0)
MCHC: 34.2 g/dL (ref 30.0–36.0)
MCV: 90.2 fl (ref 78.0–100.0)
Monocytes Absolute: 0.6 10*3/uL (ref 0.1–1.0)
Monocytes Relative: 7 % (ref 3.0–12.0)
Neutro Abs: 5 10*3/uL (ref 1.4–7.7)
Neutrophils Relative %: 63 % (ref 43.0–77.0)
Platelets: 234 10*3/uL (ref 150.0–400.0)
RBC: 5.24 Mil/uL (ref 4.22–5.81)
RDW: 13.4 % (ref 11.5–15.5)
WBC: 7.9 10*3/uL (ref 4.0–10.5)

## 2022-01-26 LAB — COMPREHENSIVE METABOLIC PANEL
ALT: 14 U/L (ref 0–53)
AST: 15 U/L (ref 0–37)
Albumin: 4.7 g/dL (ref 3.5–5.2)
Alkaline Phosphatase: 62 U/L (ref 39–117)
BUN: 16 mg/dL (ref 6–23)
CO2: 31 mEq/L (ref 19–32)
Calcium: 9.6 mg/dL (ref 8.4–10.5)
Chloride: 102 mEq/L (ref 96–112)
Creatinine, Ser: 1.17 mg/dL (ref 0.40–1.50)
GFR: 86.74 mL/min (ref >60.00)
Glucose, Bld: 80 mg/dL (ref 70–99)
Potassium: 4.2 mEq/L (ref 3.5–5.1)
Sodium: 141 mEq/L (ref 135–145)
Total Bilirubin: 0.7 mg/dL (ref 0.2–1.2)
Total Protein: 7.6 g/dL (ref 6.0–8.3)

## 2022-01-26 LAB — LIPID PANEL
Cholesterol: 222 mg/dL — ABNORMAL HIGH (ref 0–200)
HDL: 48.4 mg/dL (ref >39.00)
NonHDL: 173.29
Total CHOL/HDL Ratio: 5
Triglycerides: 270 mg/dL — ABNORMAL HIGH (ref 0.0–149.0)
VLDL: 54 mg/dL — ABNORMAL HIGH (ref 0.0–40.0)

## 2022-01-26 LAB — HEPATITIS C ANTIBODY
Hepatitis C Ab: NONREACTIVE
SIGNAL TO CUT-OFF: 0.08 (ref <1.00)

## 2022-01-26 LAB — HIV ANTIBODY (ROUTINE TESTING W REFLEX): HIV 1&2 Ab, 4th Generation: NONREACTIVE

## 2022-01-26 LAB — LDL CHOLESTEROL, DIRECT: Direct LDL: 147 mg/dL

## 2022-01-26 NOTE — Progress Notes (Signed)
Mailed out to patient 

## 2022-01-31 ENCOUNTER — Other Ambulatory Visit (HOSPITAL_BASED_OUTPATIENT_CLINIC_OR_DEPARTMENT_OTHER): Payer: Self-pay

## 2022-01-31 MED ORDER — PFIZER COVID-19 VAC BIVALENT 30 MCG/0.3ML IM SUSP
INTRAMUSCULAR | 0 refills | Status: DC
  Filled 2022-01-31: qty 0.3, 1d supply, fill #0

## 2022-07-27 ENCOUNTER — Ambulatory Visit: Payer: 59

## 2023-01-30 ENCOUNTER — Ambulatory Visit (INDEPENDENT_AMBULATORY_CARE_PROVIDER_SITE_OTHER): Payer: 59 | Admitting: Family

## 2023-01-30 ENCOUNTER — Encounter: Payer: Self-pay | Admitting: Family

## 2023-01-30 VITALS — BP 123/82 | HR 57 | Temp 97.5°F | Resp 16 | Ht 69.0 in | Wt 149.0 lb

## 2023-01-30 DIAGNOSIS — Z113 Encounter for screening for infections with a predominantly sexual mode of transmission: Secondary | ICD-10-CM

## 2023-01-30 DIAGNOSIS — Z13220 Encounter for screening for lipoid disorders: Secondary | ICD-10-CM | POA: Diagnosis not present

## 2023-01-30 DIAGNOSIS — N02B9 Other recurrent and persistent immunoglobulin A nephropathy: Secondary | ICD-10-CM

## 2023-01-30 DIAGNOSIS — Z0000 Encounter for general adult medical examination without abnormal findings: Secondary | ICD-10-CM | POA: Diagnosis not present

## 2023-01-30 DIAGNOSIS — Z23 Encounter for immunization: Secondary | ICD-10-CM | POA: Diagnosis not present

## 2023-01-30 DIAGNOSIS — R41840 Attention and concentration deficit: Secondary | ICD-10-CM | POA: Diagnosis not present

## 2023-01-30 LAB — CBC WITH DIFFERENTIAL/PLATELET
Basophils Absolute: 0.1 10*3/uL (ref 0.0–0.1)
Basophils Relative: 0.9 % (ref 0.0–3.0)
Eosinophils Absolute: 0.4 10*3/uL (ref 0.0–0.7)
Eosinophils Relative: 5 % (ref 0.0–5.0)
HCT: 46.9 % (ref 39.0–52.0)
Hemoglobin: 16 g/dL (ref 13.0–17.0)
Lymphocytes Relative: 31.5 % (ref 12.0–46.0)
Lymphs Abs: 2.6 10*3/uL (ref 0.7–4.0)
MCHC: 34.2 g/dL (ref 30.0–36.0)
MCV: 88.9 fl (ref 78.0–100.0)
Monocytes Absolute: 0.7 10*3/uL (ref 0.1–1.0)
Monocytes Relative: 8.3 % (ref 3.0–12.0)
Neutro Abs: 4.4 10*3/uL (ref 1.4–7.7)
Neutrophils Relative %: 54.3 % (ref 43.0–77.0)
Platelets: 246 10*3/uL (ref 150.0–400.0)
RBC: 5.28 Mil/uL (ref 4.22–5.81)
RDW: 13.2 % (ref 11.5–15.5)
WBC: 8.1 10*3/uL (ref 4.0–10.5)

## 2023-01-30 LAB — COMPREHENSIVE METABOLIC PANEL
ALT: 14 U/L (ref 0–53)
AST: 19 U/L (ref 0–37)
Albumin: 4.5 g/dL (ref 3.5–5.2)
Alkaline Phosphatase: 74 U/L (ref 39–117)
BUN: 22 mg/dL (ref 6–23)
CO2: 30 mEq/L (ref 19–32)
Calcium: 9.6 mg/dL (ref 8.4–10.5)
Chloride: 101 mEq/L (ref 96–112)
Creatinine, Ser: 1.2 mg/dL (ref 0.40–1.50)
GFR: 83.55 mL/min (ref >60.00)
Glucose, Bld: 82 mg/dL (ref 70–99)
Potassium: 4.3 mEq/L (ref 3.5–5.1)
Sodium: 139 mEq/L (ref 135–145)
Total Bilirubin: 0.6 mg/dL (ref 0.2–1.2)
Total Protein: 7.5 g/dL (ref 6.0–8.3)

## 2023-01-30 LAB — LIPID PANEL
Cholesterol: 202 mg/dL — ABNORMAL HIGH (ref 0–200)
HDL: 47.6 mg/dL (ref >39.00)
NonHDL: 154.54
Total CHOL/HDL Ratio: 4
Triglycerides: 268 mg/dL — ABNORMAL HIGH (ref 0.0–149.0)
VLDL: 53.6 mg/dL — ABNORMAL HIGH (ref 0.0–40.0)

## 2023-01-30 LAB — TSH: TSH: 4.03 u[IU]/mL (ref 0.35–5.50)

## 2023-01-30 LAB — LDL CHOLESTEROL, DIRECT: Direct LDL: 129 mg/dL

## 2023-01-30 NOTE — Assessment & Plan Note (Addendum)
New. Refer for formal ADHD testing.

## 2023-01-30 NOTE — Assessment & Plan Note (Signed)
Not following with nephrology. Encouraged pt to establish with nephrology- referral has been replaced.

## 2023-01-30 NOTE — Assessment & Plan Note (Signed)
Discussed healthy diet, exercise. Encouraged him to update his vision exam.  Gardisil #3 today.

## 2023-01-30 NOTE — Progress Notes (Signed)
Subjective:   By signing my name below, I, Shehryar Baig, attest that this documentation has been prepared under the direction and in the presence of Sandford Craze, NP. 01/30/2023   Patient ID: Seth Jimenez Comment, male    DOB: 09-Sep-1996, 27 y.o.   MRN: [id number]  No chief complaint on file.   HPI Patient is in today for  a comprehensive physical exam.   ADHD: He is interested in ADHD screening. He did fine in high school but slept frequently. He struggled after his first year of university. He gets sidetracked from tasks easily.   Ankle: He sprained his ankle early January 2024 and seen an orthopedic specialist. He was instructed to wear a boot and has taken it off per their instructions at this time. He continues having pain in his ankle a this time.   Kidney: He is not following up with a kidney specialist at this time. He was contacted by them after receiving a referral but did not follow back up with them. He is interested in trying to establish an appointment again.   Acute: He denies fever, new moles, congestion, sinus pain, sore throat, chest pain, palpitations, cough, shortness of breath, wheezing, nausea, vomiting, abdominal pain, diarrhea, constipation, dysuria, frequency, hematuria, new muscle pain, new joint pain, or headaches at this time.   Social history: He has no changes to his family medical history. He has no new surgical procedures to report. He is sexually active with a male partner and sometimes uses condoms for birth control. He is interested in STD testing at this time. He does not drink alcohol, he does not use drugs. He does not use vaping or tobacco products.   Immunizations: He is due for his third gardasil vaccine. He did not receive a Covid-19 in fall 2023.   Diet: He is managing a ok diet at this time.   Exercise: He is participation in regular exercise.   Dental: He is UTD on dental care.   Vision: He is due for vision care.    Past Medical  History:  Diagnosis Date   Bronchitis, acute    HTN (hypertension)    Kidney disease    IgA nephropathy   Nausea and vomiting     Past Surgical History:  Procedure Laterality Date   kidney biopsy  03/2013   IgA nephropathy with mesangial hyperscellularity, mild acute tubulo-interstital nephritis    no past suregeries      Family History  Problem Relation Age of Onset   Hypertension Father    Diabetes Maternal Grandmother    Diabetes Paternal Grandfather     Social History   Socioeconomic History   Marital status: Single    Spouse name: Not on file   Number of children: Not on file   Years of education: Not on file   Highest education level: Not on file  Occupational History   Not on file  Tobacco Use   Smoking status: Never   Smokeless tobacco: Never  Vaping Use   Vaping Use: Never used  Substance and Sexual Activity   Alcohol use: No   Drug use: No   Sexual activity: Yes    Partners: Female    Birth control/protection: Condom  Other Topics Concern   Not on file  Social History Narrative   Was at [employer]- dropped out   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     Lives with Mom, dad, 2 younger brothers and brother's girlfriend.   Enjoys drawing, video  games   2 dogs       Social Determinants of Health   Financial Resource Strain: Not on file  Food Insecurity: Not on file  Transportation Needs: Not on file  Physical Activity: Not on file  Stress: Not on file  Social Connections: Not on file  Intimate Partner Violence: Not on file    Outpatient Medications Prior to Visit  Medication Sig Dispense Refill   COVID-19 mRNA bivalent vaccine, Pfizer, (PFIZER COVID-19 VAC BIVALENT) injection Inject into the muscle. 0.3 mL 0   No facility-administered medications prior to visit.    No Known Allergies  Review of Systems  Constitutional:  Negative for fever.       (-)unexpected weight change (-)Adenopathy  HENT:  Negative for congestion, sinus pain and sore throat.    Eyes:        (-)Visual disturbance  Respiratory:  Negative for cough, shortness of breath and wheezing.   Cardiovascular:  Negative for chest pain, palpitations and leg swelling.  Gastrointestinal:  Negative for blood in stool, constipation, diarrhea, nausea and vomiting.  Genitourinary:  Negative for dysuria, frequency and hematuria.  Musculoskeletal:        (-)new muscle pain (-)new joint pain  Skin:        (-)new moles  Neurological:  Negative for dizziness and headaches.  Psychiatric/Behavioral:  Negative for depression. The patient is not nervous/anxious.        Objective:    Physical Exam Constitutional:      General: He is not in acute distress.    Appearance: Normal appearance. He is not ill-appearing.  HENT:     Head: Normocephalic and atraumatic.     Right Ear: Ear canal and external ear normal. There is impacted cerumen.     Left Ear: Ear canal and external ear normal. There is impacted cerumen.  Eyes:     Extraocular Movements: Extraocular movements intact.     Right eye: No nystagmus.     Left eye: No nystagmus.     Pupils: Pupils are equal, round, and reactive to light.  Cardiovascular:     Rate and Rhythm: Normal rate and regular rhythm.     Heart sounds: Normal heart sounds. No murmur heard.    No gallop.  Pulmonary:     Effort: Pulmonary effort is normal. No respiratory distress.     Breath sounds: Normal breath sounds. No wheezing or rales.  Abdominal:     General: Bowel sounds are normal. There is no distension.     Palpations: Abdomen is soft.     Tenderness: There is no abdominal tenderness. There is no guarding.  Musculoskeletal:     Comments: 5/5 strength in both upper and lower extremities  Lymphadenopathy:     Cervical: No cervical adenopathy.  Skin:    General: Skin is warm and dry.  Neurological:     Mental Status: He is alert and oriented to person, place, and time.     Deep Tendon Reflexes:     Reflex Scores:      Patellar reflexes  are 2+ on the right side and 2+ on the left side. Psychiatric:        Judgment: Judgment normal.     BP 123/82 (BP Location: Right Arm, Patient Position: Sitting, Cuff Size: Small)   Pulse (!) 57   Temp (!) 97.5 F (36.4 C) (Oral)   Resp 16   Ht 5\' 9"  (1.753 m)   Wt 149 lb (67.6 kg)  SpO2 98%   BMI 22.00 kg/m  Wt Readings from Last 3 Encounters:  01/30/23 149 lb (67.6 kg)  01/25/22 141 lb 3.2 oz (64 kg)  07/16/21 140 lb (63.5 kg)       Assessment & Plan:  Preventative health care Assessment & Plan: Discussed healthy diet, exercise. Encouraged him to update his vision exam.  Gardisil #3 today.   Orders: -     Lipid panel -     CBC with Differential/Platelet -     TSH  IgA nephropathy Assessment & Plan: Not following with nephrology. Encouraged pt to establish with nephrology- referral has been replaced.   Orders: -     Ambulatory referral to Nephrology -     Comprehensive metabolic panel  Screening examination for STD (sexually transmitted disease) -     Hepatitis B surface antigen -     Hepatitis C antibody -     Urine cytology ancillary only -     HIV Antibody (routine testing w rflx) -     RPR -     HSV 2 antibody, IgG  Attention deficit Assessment & Plan: Refer for formal ADHD testing.   Orders: -     Ambulatory referral to Psychology  Need for HPV vaccination -     HPV 9-valent vaccine,Recombinat    I, Lemont Fillers, NP, personally preformed the services described in this documentation.  All medical record entries made by the scribe were at my direction and in my presence.  I have reviewed the chart and discharge instructions (if applicable) and agree that the record reflects my personal performance and is accurate and complete. 01/30/2023   I,Shehryar Baig,acting as a Neurosurgeon for Lemont Fillers, NP.,have documented all relevant documentation on the behalf of Lemont Fillers, NP,as directed by  Lemont Fillers, NP while in the  presence of Lemont Fillers, NP.   Lemont Fillers, NP

## 2023-01-31 LAB — HEPATITIS B SURFACE ANTIGEN: Hepatitis B Surface Ag: NONREACTIVE

## 2023-01-31 LAB — HEPATITIS C ANTIBODY: Hepatitis C Ab: NONREACTIVE

## 2023-01-31 LAB — HIV ANTIBODY (ROUTINE TESTING W REFLEX): HIV 1&2 Ab, 4th Generation: NONREACTIVE

## 2023-02-01 LAB — HSV 2 ANTIBODY, IGG: HSV 2 Glycoprotein G Ab, IgG: <0.9 index

## 2023-02-01 LAB — RPR: RPR Ser Ql: NONREACTIVE

## 2023-02-10 ENCOUNTER — Telehealth: Payer: Self-pay | Admitting: Family

## 2023-02-10 DIAGNOSIS — Z113 Encounter for screening for infections with a predominantly sexual mode of transmission: Secondary | ICD-10-CM

## 2023-02-10 NOTE — Telephone Encounter (Signed)
Somehow the urine ancillary testing did not get sent to the lab.  Can you please ask him to return to the lab to provide another specimen?   Future order placed.  You can let him know that all of his blood work looks good except triglycerides are mildly elevated.  Please work on avoiding concentrated sweets, and limiting white carbs (rice/bread/pasta/potatoes).

## 2023-02-10 NOTE — Telephone Encounter (Signed)
Patient advised of results and he reports he did not provide urine that day. He was scheduled to come in Monday for this test

## 2023-02-13 ENCOUNTER — Other Ambulatory Visit: Payer: 59

## 2023-04-11 LAB — LAB REPORT - SCANNED: EGFR: 76

## 2023-11-15 ENCOUNTER — Ambulatory Visit (INDEPENDENT_AMBULATORY_CARE_PROVIDER_SITE_OTHER): Payer: 59 | Admitting: Family

## 2023-11-15 VITALS — BP 139/89 | HR 91 | Temp 99.8°F | Resp 16 | Ht 69.0 in | Wt 147.0 lb

## 2023-11-15 DIAGNOSIS — J111 Influenza due to unidentified influenza virus with other respiratory manifestations: Secondary | ICD-10-CM | POA: Insufficient documentation

## 2023-11-15 MED ORDER — BENZONATATE 100 MG PO CAPS: 100.0000 mg | ORAL_CAPSULE | Freq: Three times a day (TID) | ORAL | 0 refills | Status: AC | PRN

## 2023-11-15 MED ORDER — OSELTAMIVIR PHOSPHATE 75 MG PO CAPS: 75.0000 mg | ORAL_CAPSULE | Freq: Two times a day (BID) | ORAL | 0 refills | Status: AC

## 2023-11-15 NOTE — Patient Instructions (Signed)
 VISIT SUMMARY:  Seth Jimenez, a 28 year old male, came in today with flu-like symptoms after being exposed to someone with the flu. He has been experiencing a cough, fever, and sore throat for the past two days. He has been managing his symptoms with Tylenol and has not received a flu shot this year.  YOUR PLAN:  -INFLUENZA-LIKE ILLNESS: You have symptoms that are consistent with the flu, including fever, cough, and sore throat. We will treat you with tamiflu  to hopefully decrease the duration and severity of your symptoms.  In the meantime, continue taking Tylenol to manage your symptoms and rest as much as possible.

## 2023-11-15 NOTE — Assessment & Plan Note (Signed)
 Exposure and symptoms consistent with flu, although rapid testing is negative. Suspect false negative result.  Will go ahead and treat with empiric tamiflu  and prn tessalon  for cough. Recommended tylenol as needed for fever/myalgia, rest, hydration. Follow up if symptoms worsen or if not improved in 3-4 days.

## 2023-11-15 NOTE — Progress Notes (Signed)
 Subjective:     Patient ID: Seth Jimenez, male    DOB: June 05, 1996, 28 y.o.   MRN: [id number]  Chief Complaint  Patient presents with   Fever    Patient complains of temperature of up to 101, started 2 days ago   Sore Throat    Patient complains of sore throat   Generalized Body Aches    Complains of body aches   Exposure to influenza    Patient was expose to influenza Saturday     Discussed the use of AI scribe software for clinical note transcription with the patient, who gave verbal consent to proceed.  History of Present Illness   Seth Jimenez is a 28 year old male who presents with flu-like symptoms after exposure to someone with the flu. He is accompanied by Levorn, who mentioned the exposure to the flu.  He was exposed to someone with the flu on Saturday and began experiencing symptoms two days ago, which was Monday.  He has a cough, fever, and sore throat. The fever is intermittent, with episodes of feeling 'really hot' and sweating a lot. A recorded fever of up to 101F was noted by a nurse at his workplace, leading to him being sent home. No chest pain, shortness of breath, or palpitations.  For symptom relief, he has taken Tylenol. He denies having received a flu shot this year.          Health Maintenance Due  Topic Date Due   INFLUENZA VACCINE  05/11/2023   COVID-19 Vaccine (4 - 2024-25 season) 06/11/2023    Past Medical History:  Diagnosis Date   Bronchitis, acute    HTN (hypertension)    Kidney disease    IgA nephropathy   Nausea and vomiting     Past Surgical History:  Procedure Laterality Date   kidney biopsy  03/2013   IgA nephropathy with mesangial hyperscellularity, mild acute tubulo-interstital nephritis    no past suregeries      Family History  Problem Relation Age of Onset   Hypertension Father    Diabetes Maternal Grandmother    Diabetes Paternal Grandfather     Social History   Socioeconomic History   Marital status:  Single    Spouse name: Not on file   Number of children: Not on file   Years of education: Not on file   Highest education level: Not on file  Occupational History   Not on file  Tobacco Use   Smoking status: Never   Smokeless tobacco: Never  Vaping Use   Vaping status: Never Used  Substance and Sexual Activity   Alcohol use: No   Drug use: No   Sexual activity: Yes    Partners: Female    Birth control/protection: Condom  Other Topics Concern   Not on file  Social History Narrative   Was at [employer]- dropped out   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     Lives with Mom, dad, 2 younger brothers and brother's girlfriend.   Enjoys drawing, video games   2 dogs       Social Drivers of Corporate Investment Banker Strain: Not on file  Food Insecurity: Not on file  Transportation Needs: Not on file  Physical Activity: Not on file  Stress: Not on file  Social Connections: Not on file  Intimate Partner Violence: Not on file    No outpatient medications prior to visit.   No facility-administered medications prior to visit.  No Known Allergies  Review of Systems  Constitutional:  Positive for fever.       Objective:    Physical Exam Constitutional:      General: He is not in acute distress.    Appearance: He is well-developed.  HENT:     Head: Normocephalic and atraumatic.     Right Ear: External ear normal. There is impacted cerumen.     Left Ear: External ear normal. There is impacted cerumen.     Mouth/Throat:     Pharynx: Uvula midline. Posterior oropharyngeal erythema present. No pharyngeal swelling, oropharyngeal exudate or uvula swelling.     Tonsils: 2+ on the right. 2+ on the left.  Cardiovascular:     Rate and Rhythm: Normal rate and regular rhythm.     Heart sounds: No murmur heard. Pulmonary:     Effort: Pulmonary effort is normal. No respiratory distress.     Breath sounds: Normal breath sounds. No wheezing or rales.  Lymphadenopathy:     Cervical: No  cervical adenopathy.  Skin:    General: Skin is warm and dry.  Neurological:     Mental Status: He is alert and oriented to person, place, and time.  Psychiatric:        Behavior: Behavior normal.        Thought Content: Thought content normal.      BP 139/89 (BP Location: Left Arm, Patient Position: Sitting, Cuff Size: Normal)   Pulse 91   Temp 99.8 F (37.7 C) (Oral)   Resp 16   Ht 5' 9 (1.753 m)   Wt 147 lb (66.7 kg)   SpO2 100%   BMI 21.71 kg/m  Wt Readings from Last 3 Encounters:  11/15/23 147 lb (66.7 kg)  01/30/23 149 lb (67.6 kg)  01/25/22 141 lb 3.2 oz (64 kg)       Assessment & Plan:   Problem List Items Addressed This Visit       Unprioritized   Influenza - Primary   Exposure and symptoms consistent with flu, although rapid testing is negative. Suspect false negative result.  Will go ahead and treat with empiric tamiflu  and prn tessalon  for cough. Recommended tylenol as needed for fever/myalgia, rest, hydration. Follow up if symptoms worsen or if not improved in 3-4 days.      Relevant Medications   oseltamivir  (TAMIFLU ) 75 MG capsule    I am having Elex H. Amendola start on oseltamivir  and benzonatate .  Meds ordered this encounter  Medications   oseltamivir  (TAMIFLU ) 75 MG capsule    Sig: Take 1 capsule (75 mg total) by mouth 2 (two) times daily for 5 days.    Dispense:  10 capsule    Refill:  0    Supervising Provider:   DOMENICA BLACKBIRD A [[id number]]   benzonatate  (TESSALON ) 100 MG capsule    Sig: Take 1 capsule (100 mg total) by mouth 3 (three) times daily as needed for cough.    Dispense:  20 capsule    Refill:  0    Supervising Provider:   DOMENICA BLACKBIRD A [[id number]]

## 2024-01-31 ENCOUNTER — Encounter: Payer: 59 | Admitting: Family

## 2024-06-20 ENCOUNTER — Ambulatory Visit: Payer: Self-pay

## 2024-06-20 NOTE — Telephone Encounter (Signed)
 FYI Only or Action Required?: Action required by provider: request for appointment.  Patient was last seen in primary care on 11/15/2023 by Daryl Setter, NP.  Called Nurse Triage reporting Rash.  Symptoms began several months ago.  Interventions attempted: Nothing.  Symptoms are: unchanged.  Triage Disposition: See PCP Within 2 Weeks  Patient/caregiver understands and will follow disposition?: Yes    Reason for Disposition  [1] Localized area of skin darkening or thickening on lower legs or ankles AND [2] has NOT been evaluated by a doctor (or NP/PA)  Answer Assessment - Initial Assessment Questions Scheduled appt 06/21/24. Advised UC/ED if symptoms worsen.  1. APPEARANCE of RASH: What does the rash look like? (e.g., blisters, dry flaky skin, red spots, redness, sores)     Blackish gray, flat, dry Denies swellling, pain,  redness, bumps or blisters, no openings, drainage 2. LOCATION: Where is the rash located?      Back of leg; starting behind knee up to back thigh 3. NUMBER: How many spots are there?      Multiple spots 4. SIZE: How big are the spots? (e.g., inches, cm; or compare to size of pinhead, tip of pen, eraser, pea)      Quarter size, largerst spot 5. ONSET: When did the rash start?      2 months ago 6. ITCHING: Does the rash itch? If Yes, ask: How bad is the itch?  (Scale 0-10; or none, mild, moderate, severe)     Mild; not much,  7. PAIN: Does the rash hurt? If Yes, ask: How bad is the pain?  (Scale 0-10; or none, mild, moderate, severe)     0 8. OTHER SYMPTOMS: Do you have any other symptoms? (e.g., fever)     Denies fever Denies numbness/ tingling, weakness, dizziness, HA, diff breathing  Protocols used: Rash or Redness - Localized-A-AH

## 2024-06-20 NOTE — Telephone Encounter (Signed)
   Message from [place] R sent at 06/20/2024  2:15 PM EDT  Summary: Rash   Patient has a rash behind his knee for over a month. It doesn't itch and is black/dark in color.  Patient can be reached at [phone]

## 2024-06-20 NOTE — Telephone Encounter (Signed)
 Appt scheduled

## 2024-06-21 ENCOUNTER — Ambulatory Visit (INDEPENDENT_AMBULATORY_CARE_PROVIDER_SITE_OTHER): Admitting: Medical

## 2024-06-21 ENCOUNTER — Encounter: Payer: Self-pay | Admitting: Medical

## 2024-06-21 VITALS — BP 137/78 | HR 68 | Temp 97.9°F | Resp 16 | Ht 69.0 in | Wt 151.2 lb

## 2024-06-21 DIAGNOSIS — L819 Disorder of pigmentation, unspecified: Secondary | ICD-10-CM

## 2024-06-21 DIAGNOSIS — R21 Rash and other nonspecific skin eruption: Secondary | ICD-10-CM

## 2024-06-21 DIAGNOSIS — L309 Dermatitis, unspecified: Secondary | ICD-10-CM | POA: Diagnosis not present

## 2024-06-21 MED ORDER — TRIAMCINOLONE ACETONIDE 0.1 % EX CREA: 1.0000 | TOPICAL_CREAM | Freq: Two times a day (BID) | CUTANEOUS | 1 refills | Status: AC

## 2024-06-21 NOTE — Progress Notes (Signed)
   Subjective:    Patient ID: Seth Jimenez, male    DOB: [birth year], 28 y.o.   MRN: [id number]  HPI Seth Jimenez is a 28 year old male who presents with a rash behind his left leg and on his buttocks.  He has had a rash behind his left leg for the past two months. Initially, it was slightly pruritic, but the pruritus has not been chronic. The rash appeared suddenly. The patient is unsure if it has changed in appearance since its onset.  Other  rash is present on his left buttock, which appeared around the same time as the rash on his leg. He experiences mild pruritus in these areas occasionally, but it is not persistent.  No similar rashes are present in other areas, such as the antecubital or anterior elbow regions. He denies any history of pimples or other skin issues prior to the appearance of these rashes.   Review of Systems See hpi    Objective:   Physical Exam  General- No acute distress. Pleasant patient.  Skin- Left lower ext- back of  knee popliteal area 2 cm circular lichinfied area with scattered lichinification extending up back of leg about 16 cm. Hyperpigmentation area on left buttuck      Assessment & Plan:   Patient Instructions  Rash with eczema like lichenification. -triamcinolone  cream twice daily and Palmers moisturizing lotion with vit e once daily. -refer to dermatologist. -take picture before starting treatment.(To show dermatologist impact of treatment) -if rash changes or worsens notify me  Hyperpigmentation/scar on buttock. -Scar zone or mederma.(Can also try concentrated vit e oil)  BP elevated at first but better on recheck. Possible white coat elevation. Recommend periodic check to make sure well controlled. Use dad bp cuff.  Follow up as regularly scheduled with pcp. Notify us  if derm does not call you in 10-14 days.

## 2024-06-21 NOTE — Patient Instructions (Addendum)
 Rash with eczema like lichenification. -triamcinolone  cream twice daily and Palmers moisturizing lotion with vit e once daily. -refer to dermatologist. -take picture before starting treatment.(To show dermatologist impact of treatment) -if rash changes or worsens notify me  Hyperpigmentation/scar on buttock. -Scar zone or mederma.(Can also try concentrated vit e oil)  BP elevated at first but better on recheck. Possible white coat elevation. Recommend periodic check to make sure well controlled. Use dad bp cuff.  Follow up as regularly scheduled with pcp. Notify us  if derm does not call you in 10-14 days.

## 2024-07-02 NOTE — Telephone Encounter (Signed)
 Called patient to see if he had any improvement with treatment provided by our pcp but he reports did not try it yet. Patient was advised to try the cream and let us  know if any improvement.

## 2024-07-02 NOTE — Telephone Encounter (Signed)
   Copied from CRM [id number]. Topic: Referral - Question >> Jul 01, 2024  9:16 AM Carlatta H wrote: Reason for CRM: Patient had a referral sent to dermatology but he can't be seen until next year//Please call the patient to advise

## 2024-08-15 ENCOUNTER — Telehealth: Payer: Self-pay

## 2024-08-15 NOTE — Telephone Encounter (Signed)
 Copied from CRM #[id number]. Topic: Clinical - Medical Advice >> Aug 15, 2024  2:45 PM Robinson H wrote: Reason for CRM: Patient states he's traveling internationally mid December and needs to know which vaccines he's already had and which vaccines will he need.  [place] [phone]

## 2024-08-19 ENCOUNTER — Telehealth: Payer: Self-pay | Admitting: Family

## 2024-08-19 MED ORDER — VIVOTIF PO CPDR: DELAYED_RELEASE_CAPSULE | ORAL | 0 refills | Status: AC

## 2024-08-19 NOTE — Telephone Encounter (Signed)
 Please advise pt that I recommend that he get flu and covid boosters at his pharmacy.  I would recommend typhoid as well. Rx sent to his pharmacy.  He should start these pills 2 weeks before he leaves for his trip.  If he is going to remote/rural areas please let me know as the recommendations for those areas are different.

## 2024-08-19 NOTE — Telephone Encounter (Signed)
 Patient reports he is not going to any rural areas. He was advised of recommendations and new pescription

## 2025-01-21 ENCOUNTER — Ambulatory Visit: Admitting: Physician Assistant
